# Patient Record
Sex: Female | Born: 1955 | Race: White | Hispanic: Yes | Marital: Married | Smoking: Never smoker
Health system: Southern US, Community
[De-identification: ages and names within clinical notes are randomized; demographics above are authoritative.]

## PROBLEM LIST (undated history)

## (undated) DIAGNOSIS — I509 Heart failure, unspecified: Secondary | ICD-10-CM

## (undated) DIAGNOSIS — M1990 Unspecified osteoarthritis, unspecified site: Secondary | ICD-10-CM

## (undated) DIAGNOSIS — N200 Calculus of kidney: Secondary | ICD-10-CM

## (undated) DIAGNOSIS — F32A Depression, unspecified: Secondary | ICD-10-CM

## (undated) DIAGNOSIS — E785 Hyperlipidemia, unspecified: Secondary | ICD-10-CM

## (undated) DIAGNOSIS — I10 Essential (primary) hypertension: Secondary | ICD-10-CM

## (undated) DIAGNOSIS — F329 Major depressive disorder, single episode, unspecified: Secondary | ICD-10-CM

## (undated) DIAGNOSIS — E119 Type 2 diabetes mellitus without complications: Secondary | ICD-10-CM

## (undated) DIAGNOSIS — I208 Other forms of angina pectoris: Secondary | ICD-10-CM

## (undated) DIAGNOSIS — F419 Anxiety disorder, unspecified: Secondary | ICD-10-CM

## (undated) DIAGNOSIS — I739 Peripheral vascular disease, unspecified: Secondary | ICD-10-CM

## (undated) DIAGNOSIS — IMO0001 Reserved for inherently not codable concepts without codable children: Secondary | ICD-10-CM

## (undated) HISTORY — DX: Hyperlipidemia, unspecified: E78.5

## (undated) HISTORY — DX: Anxiety disorder, unspecified: F41.9

## (undated) HISTORY — PX: JOINT REPLACEMENT: SHX530

## (undated) HISTORY — DX: Depression, unspecified: F32.A

## (undated) HISTORY — PX: SHOULDER ARTHROSCOPY: SHX128

## (undated) HISTORY — DX: Major depressive disorder, single episode, unspecified: F32.9

## (undated) HISTORY — DX: Unspecified osteoarthritis, unspecified site: M19.90

---

## 2007-05-22 HISTORY — PX: TOTAL HIP ARTHROPLASTY: SHX124

## 2012-10-18 ENCOUNTER — Observation Stay: Payer: Self-pay | Admitting: Internal Medicine

## 2012-10-18 LAB — COMPREHENSIVE METABOLIC PANEL
Albumin: 3.6 g/dL (ref 3.4–5.0)
Alkaline Phosphatase: 111 U/L (ref 50–136)
Bilirubin,Total: 0.2 mg/dL (ref 0.2–1.0)
Chloride: 105 mmol/L (ref 98–107)
Osmolality: 287 (ref 275–301)
SGOT(AST): 42 U/L — ABNORMAL HIGH (ref 15–37)
SGPT (ALT): 65 U/L (ref 12–78)
Sodium: 139 mmol/L (ref 136–145)

## 2012-10-18 LAB — CBC
HCT: 46.6 % (ref 35.0–47.0)
HGB: 15.7 g/dL (ref 12.0–16.0)
RBC: 5.25 10*6/uL — ABNORMAL HIGH (ref 3.80–5.20)
RDW: 12.7 % (ref 11.5–14.5)
WBC: 10.6 10*3/uL (ref 3.6–11.0)

## 2012-10-18 LAB — CK TOTAL AND CKMB (NOT AT ARMC)
CK, Total: 68 U/L (ref 21–215)
CK-MB: 1.1 ng/mL (ref 0.5–3.6)

## 2012-10-18 LAB — TROPONIN I: Troponin-I: <0.02 ng/mL

## 2012-10-18 LAB — LIPASE, BLOOD: Lipase: 171 U/L (ref 73–393)

## 2012-10-18 LAB — PROTIME-INR: INR: 0.9

## 2012-10-19 DIAGNOSIS — R079 Chest pain, unspecified: Secondary | ICD-10-CM

## 2012-10-19 LAB — TROPONIN I
Troponin-I: <0.02 ng/mL
Troponin-I: <0.02 ng/mL

## 2012-10-19 LAB — LIPID PANEL
Cholesterol: 207 mg/dL — ABNORMAL HIGH (ref 0–200)
HDL Cholesterol: 33 mg/dL — ABNORMAL LOW (ref 40–60)
Ldl Cholesterol, Calc: 142 mg/dL — ABNORMAL HIGH (ref 0–100)
Triglycerides: 159 mg/dL (ref 0–200)
VLDL Cholesterol, Calc: 32 mg/dL (ref 5–40)

## 2012-10-19 LAB — BASIC METABOLIC PANEL
Anion Gap: 7 (ref 7–16)
BUN: 14 mg/dL (ref 7–18)
Co2: 26 mmol/L (ref 21–32)
EGFR (African American): >60
EGFR (Non-African Amer.): >60
Osmolality: 289 (ref 275–301)
Potassium: 3.5 mmol/L (ref 3.5–5.1)

## 2012-10-19 LAB — HEMOGLOBIN A1C: Hemoglobin A1C: 9.2 % — ABNORMAL HIGH (ref 4.2–6.3)

## 2012-10-19 LAB — CK TOTAL AND CKMB (NOT AT ARMC)
CK, Total: 42 U/L (ref 21–215)
CK-MB: 0.9 ng/mL (ref 0.5–3.6)

## 2014-09-10 NOTE — Discharge Summary (Signed)
PATIENT NAME:  Melissa Prince, [name]:  [id number] DATE OF BIRTH:  1955/11/09  DATE OF ADMISSION:  10/18/2012 DATE OF DISCHARGE:  10/19/2012  ADMITTING PHYSICIAN:   Dr. Adrian SaranSital Mody. DISCHARGING PHYSICIAN:  Enid Baasadhika Audrea Bolte, M.D.  PRIMARY CARE PHYSICIAN[facility]  [facility].   DISCHARGE DIAGNOSES: 1.  Chest pain, likely secondary to unstable angina.  2.  Diabetes mellitus.  3.  Hypertension.  4.  History of atherosclerotic coronary artery disease with no prior myocardial infarction.  5.  History of viral meningitis.  6.  Obesity.  7.  Depression and anxiety.  8.  Hyperlipidemia.  9.  Diabetes.   DISCHARGE HOME MEDICATIONS:  1.  Aspirin 81 mg p.o. daily.  2.  Metoprolol succinate 150 mg p.o. daily.  3.  Lisinopril 40 mg p.o. daily.  4.  Effexor 37.5 mg p.o. daily.  5.  HCTZ 25 mg p.o. daily.  6.  Metformin 500 mg p.o. daily.  7.  Lantus 54 units subcutaneously once a day at bedtime.  8.  Colace 100 mg p.o. daily.  9.  Imdur 30 mg p.o. daily.  10.  Sublingual nitroglycerin 0.4 mg every 5 minutes as needed for chest pain.  11.  Lovastatin 40 mg p.o. daily.   DISCHARGE DIET: Low sodium and ADA diet.   DISCHARGE ACTIVITY: As tolerated.   FOLLOWUP INSTRUCTIONS:   1.  The patient advised to follow up with PCP in 1 week.  2.   The patient is also advised to call nuclear medicine scheduling and the number was given to the patient to schedule for an outpatient stress test in the following week to be done by [facility] for diagnosis of unstable angina.   LABORATORY DATA AND IMAGING STUDIES PRIOR TO DISCHARGE:  1.  Sodium 114, potassium 3.5, chloride 107, bicarb 26, BUN 14, creatinine 0.57, glucose 266 and calcium of 8.5. HbA1c is 9.2.  2.   LDL 142, HDL 33, triglycerides 159, total cholesterol 207.  3.  TSH within normal limits at 2.79.  4.  WBC 10.6, hemoglobin 15.7, hematocrit 46.6, platelet count is 203.  5.  Echocardiogram showing LV ejection fraction is 50% to 55%,  low normal global left ventricular systolic function and mild septal and anteroseptal hypokinesis, impaired relaxation of LV diastolic filling and mild concentric left ventricular hypertrophy is present.   BRIEF HOSPITAL COURSE:  Mr. Melissa Prince is a 59 year old obese female with past medical history significant for hypertension, diabetes, depression, who had a prior cardiac cath revealing atherosclerotic mild disease in the coronary arteries, presented to the hospital secondary to chest pain.  1.  Chest pain: She does have a lot of anxiety too, however, given her risk factors, it could be very well unstable angina. She was monitored on telemetry and 3 sets of troponins were done, all of which were negative. Her chest pain improved with nitro on admission, and did not have any chest pain at the time of discharge. Unfortunately, the patient got admitted over a weekend, and she was advised to stay until Monday to get her stress test done, and the patient, because she felt better, did not come to stay for the stress test, so an echocardiogram was ordered, which showed  low normal LV systolic function with mild diastolic dysfunction, EF is 50% to 55% and very mild septal, anteroseptal hypokinesis. Since her troponins were negative, she was asymptomatic, no EKG changes, and echo overall looked okay, she was discharged, and she was given the number to call nuclear  medicine scheduling for scheduling stress test as an outpatient in the following week, which the patient said she would do.  2.  Diabetes mellitus. HbA1c slightly elevated. She is on Lantus and also metformin. Sugars were not really very elevated while in the hospital.  3.  Hypertension. Her home medications were continued with lisinopril, metoprolol and HCTZ. However, her Norvasc was changed over to Imdur to see if that would help her angina pain.  4.  Her depression and anxiety were very well controlled with her Effexor.  Her clinical status has been  otherwise uneventful in the hospital.   DISCHARGE CONDITION: Stable.   DISCHARGE DISPOSITION: Home.   TIME SPENT ON DISCHARGE: 45 minutes.    ____________________________ Enid Baas, MD rk:dmm D: 10/20/2012 10:10:41 ET T: 10/20/2012 11:11:26 ET JOB#: [id number]  cc: Enid Baas, MD, <Dictator> [facility] Enid Baas MD ELECTRONICALLY SIGNED 10/27/2012 15:31

## 2014-09-10 NOTE — H&P (Signed)
PATIENT NAME:  Melissa Prince, Melissa Prince MR#:  [id number] DATE OF BIRTH:  February 01, 1956  DATE OF ADMISSION:  10/18/2012  PRIMARY CARE PHYSICIAN:  Lillette Boxer, PA, in [facility].   CHIEF COMPLAINT:  Chest pain.   HISTORY OF PRESENT ILLNESS: A 59 year old female with history of hypertension, hyperlipidemia, diabetes, with degenerative heart disease, who presents with the above complaint. Between 4:00 and 5:00 today, the patient was out on her porch when she developed left-sided substernal chest pain without any radiation, but she felt very nauseous. She felt it was kind of more like a pressure, like an elephant was sitting on her chest. It lasted about 15 minutes. Her husband called EMS. It actually was relieved with oxygen and nitroglycerin. She has not had any kind of chest pain or pressure since this treatment. Six years ago she said she had a cardiac catheterization at [facility]. They diagnosed her with degenerative heart disease and some small vessel disease.   REVIEW OF SYSTEMS:   CONSTITUTIONAL:  No fever, fatigue, weakness, weight loss or gain. EYES:  No blurry or double vision, glaucoma or cataracts.  EARS, NOSE, THROAT:  No ear pain, hearing loss, seasonal allergies, or epistaxis.  RESPIRATORY: No cough, wheezing, hemoptysis, dyspnea.  CARDIOVASCULAR:  Chest pain as mentioned above. No palpitations, orthopnea, syncope, edema, arrhythmia. GASTROINTESTINAL:  She had nausea with the chest pain. No vomiting, diarrhea, abdominal pain, melena, or ulcers.  GENITOURINARY:  No dysuria or hematuria.  ENDOCRINE:  No polyuria or polydipsia.  HEMATOLOGIC/LYMPHATIC: No anemia or easy bruising.  SKIN:  No rash or lesions. MUSCULOSKELETAL:  Limited activity due to her obesity.  NEUROLOGIC:  No history of CVA, TIA, seizures.  PSYCHIATRIC:  She does have depression.  PAST MEDICAL HISTORY:   1.  Diabetes. 2.  Hypertension. 3.  Questionable CAD with degenerative heart disease, as per her.  4.   History of viral meningitis. 5.  History of depression.   MEDICATIONS: 1.  Aspirin 81 mg daily.  2.  Lantus 54 units at bedtime.  3.  Metformin 500 mg daily.  4.  Norvasc 10 mg daily.  5.  Hydrochlorothiazide 25 mg daily.  6.  Lisinopril 40 mg daily.  7.  Metoprolol 150 mg daily.  8.  Effexor 37.5 mg b.i.d. 9.  Lovastatin 10 mg at night   ALLERGIES:  CODEINE and MORPHINE cause nausea and vomiting.   SURGICAL HISTORY:  She had a hip replacement.   FAMILY HISTORY: Positive for CAD. Her mom had CAD at age 35. Her dad had cancer. Brother has a pacemaker.   SOCIAL HISTORY: No tobacco, alcohol or drug use. She recently moved to [place] from [facility].   PHYSICAL EXAMINATION: VITAL SIGNS:  Temperature 98.1, pulse is 104, respirations 18, blood pressure 195/91, 100% on room air.  GENERAL: The patient is alert and oriented, not in acute distress.  HEENT:  Head is atraumatic. Pupils are round. Sclerae anicteric. Mucous membranes are moist. Oropharynx is clear.  NECK:  Supple, without JVD, carotid bruit, enlarged thyroid.  CARDIOVASCULAR:  Regular rate and rhythm. No appreciable murmur, gallops or rubs. Hard to palpate PMI due to body habitus.  LUNGS: Clear to auscultation without crackles, rales, rhonchi or wheezing. Normal to percussion.  BACK:  No CVA or vertebral tenderness.  ABDOMEN: Obese. Bowel sounds are positive. Nontender. Hard to appreciate organomegaly due to body habitus.  EXTREMITIES:  No clubbing, cyanosis or edema.  NEUROLOGIC:  Cranial nerves II through XII are grossly intact. No focal deficits. Finger-to-nose  is intact. Strength is 5/5 in all extremities.  SKIN:  Without rash or lesions.   LABORATORIES:  Magnesium 1.7. INR 0.9. Lipase is 171. Sodium 139, potassium 3.7, chloride 105, bicarb 26, BUN 17, creatinine 0.61, glucose 243, calcium 8.8. Bilirubin 0.2, alk phos 111, ALT 65, AST 42, total protein 7.5, albumin 3.6. Troponin less than 0.02. CK 60, CPK-MB 1.1. White  blood cells 7.6, hemoglobin 15.7, hematocrit 46.6, platelets are 233.   Chest x-ray shows no acute cardiopulmonary disease. EKG is sinus tachycardia with a left anterior fascicular block. There are no ST elevations or depressions.  ASSESSMENT AND PLAN:  A 59 year old female with questionable history of coronary artery disease, degenerative heart disease, diabetes, hypertension, who presents with unstable angina.   1.  Unstable angina. The patient will be admitted to telemetry. Will continue to cycle her cardiac enzymes. Will continue aspirin. Check fasting lipids. Continue statin therapy. I have also written for nitroglycerin p.r.n. If her troponins x 3 are all negative, then she will undergo a Myoview stress test. If these are positive, then we will need a cardiology consultation.   2.  Malignant hypertension. This could actually be resulting in her chest pain/pressure. She  received hydralazine in the ER, and I will continue her outpatient medications and have written for a p.r.n. hydralazine order.   3.  Tachycardia. Likely secondary to not taking her medications. She is on a beta blocker. She does not appear to be uncomfortable. We will restart her beta blocker, also we will monitor her on telemetry.   4.  Diabetes. We will continue Lantus. Patient also said that she is taking metformin, which we will hold. We will also give ADA diet.  5.  The patient is a FULL CODE STATUS.  Time spent:  Approximately 45 minutes.   ____________________________ Donell Beers. Benjie Karvonen, MD spm:mr D: 10/18/2012 21:32:45 ET T: 10/18/2012 22:05:13 ET JOB#: [id number]  cc: Creg Gilmer P. Benjie Karvonen, MD, <Dictator> [facility], Internal Medicine  Ikia Cincotta P Clarabelle Oscarson MD ELECTRONICALLY SIGNED 10/19/2012 15:27

## 2015-07-27 ENCOUNTER — Ambulatory Visit: Payer: Self-pay | Admitting: Family Medicine

## 2015-08-19 ENCOUNTER — Ambulatory Visit: Payer: Self-pay | Admitting: Family Medicine

## 2015-09-23 ENCOUNTER — Emergency Department
Admission: EM | Admit: 2015-09-23 | Discharge: 2015-09-23 | Disposition: A | Discharge status: Home / Self Care | Payer: Medicare Other | Attending: Emergency Medicine | Admitting: Emergency Medicine

## 2015-09-23 ENCOUNTER — Encounter: Payer: Self-pay | Admitting: *Deleted

## 2015-09-23 ENCOUNTER — Emergency Department: Payer: Medicare Other

## 2015-09-23 DIAGNOSIS — I10 Essential (primary) hypertension: Secondary | ICD-10-CM | POA: Diagnosis not present

## 2015-09-23 DIAGNOSIS — N23 Unspecified renal colic: Secondary | ICD-10-CM | POA: Diagnosis not present

## 2015-09-23 DIAGNOSIS — E119 Type 2 diabetes mellitus without complications: Secondary | ICD-10-CM | POA: Insufficient documentation

## 2015-09-23 DIAGNOSIS — R109 Unspecified abdominal pain: Secondary | ICD-10-CM | POA: Diagnosis present

## 2015-09-23 HISTORY — DX: Other forms of angina pectoris: I20.8

## 2015-09-23 HISTORY — DX: Type 2 diabetes mellitus without complications: E11.9

## 2015-09-23 HISTORY — DX: Essential (primary) hypertension: I10

## 2015-09-23 HISTORY — DX: Calculus of kidney: N20.0

## 2015-09-23 LAB — URINALYSIS COMPLETE WITH MICROSCOPIC (ARMC ONLY)
BILIRUBIN URINE: NEGATIVE
Bacteria, UA: NONE SEEN
Leukocytes, UA: NEGATIVE
NITRITE: NEGATIVE
PH: 6 (ref 5.0–8.0)
Protein, ur: NEGATIVE mg/dL
SPECIFIC GRAVITY, URINE: 1.011 (ref 1.005–1.030)

## 2015-09-23 LAB — BASIC METABOLIC PANEL
Anion gap: 12 (ref 5–15)
BUN: 13 mg/dL (ref 6–20)
CO2: 23 mmol/L (ref 22–32)
Calcium: 9.5 mg/dL (ref 8.9–10.3)
Chloride: 101 mmol/L (ref 101–111)
Creatinine, Ser: 0.72 mg/dL (ref 0.44–1.00)
GFR calc Af Amer: >60 mL/min (ref >60)
GFR calc non Af Amer: >60 mL/min (ref >60)
GLUCOSE: 257 mg/dL — AB (ref 65–99)
POTASSIUM: 3.7 mmol/L (ref 3.5–5.1)
Sodium: 136 mmol/L (ref 135–145)

## 2015-09-23 LAB — CBC
HEMATOCRIT: 45 % (ref 35.0–47.0)
Hemoglobin: 14.7 g/dL (ref 12.0–16.0)
MCH: 29 pg (ref 26.0–34.0)
MCHC: 32.7 g/dL (ref 32.0–36.0)
MCV: 88.8 fL (ref 80.0–100.0)
Platelets: 193 10*3/uL (ref 150–440)
RBC: 5.06 MIL/uL (ref 3.80–5.20)
RDW: 13.4 % (ref 11.5–14.5)
WBC: 11.5 10*3/uL — AB (ref 3.6–11.0)

## 2015-09-23 MED ORDER — KETOROLAC TROMETHAMINE 30 MG/ML IJ SOLN
30.0000 mg | Freq: Once | INTRAMUSCULAR | Status: AC
  Administered 2015-09-23: 30 mg via INTRAVENOUS
  Filled 2015-09-23: qty 1

## 2015-09-23 MED ORDER — KETOROLAC TROMETHAMINE 10 MG PO TABS: 10.0000 mg | ORAL_TABLET | Freq: Three times a day (TID) | ORAL | Status: AC | PRN

## 2015-09-23 MED ORDER — ONDANSETRON HCL 4 MG/2ML IJ SOLN
4.0000 mg | Freq: Once | INTRAMUSCULAR | Status: AC
  Administered 2015-09-23: 4 mg via INTRAVENOUS
  Filled 2015-09-23: qty 2

## 2015-09-23 MED ORDER — HYDROMORPHONE HCL 1 MG/ML IJ SOLN
0.5000 mg | Freq: Once | INTRAMUSCULAR | Status: AC
  Administered 2015-09-23: 0.5 mg via INTRAVENOUS
  Filled 2015-09-23: qty 1

## 2015-09-23 MED ORDER — SODIUM CHLORIDE 0.9 % IV BOLUS (SEPSIS)
1000.0000 mL | Freq: Once | INTRAVENOUS | Status: AC
  Administered 2015-09-23: 1000 mL via INTRAVENOUS

## 2015-09-23 MED ORDER — ONDANSETRON 4 MG PO TBDP: 4.0000 mg | ORAL_TABLET | Freq: Three times a day (TID) | ORAL | Status: DC | PRN

## 2015-09-23 MED ORDER — TAMSULOSIN HCL 0.4 MG PO CAPS: 0.4000 mg | ORAL_CAPSULE | Freq: Every day | ORAL | Status: DC

## 2015-09-23 MED ORDER — OXYCODONE-ACETAMINOPHEN 5-325 MG PO TABS: 1.0000 | ORAL_TABLET | ORAL | Status: DC | PRN

## 2015-09-23 NOTE — ED Notes (Signed)
Patient transported to CT 

## 2015-09-23 NOTE — ED Notes (Signed)
Pt back from CT

## 2015-09-23 NOTE — ED Notes (Signed)
Pt arrived via EMS from home after reporting onset of left sided flank pain beginning at 10:00 this morning. Pt has hx of kidney stones and reports last one was passed on Wednesday of this week. Pt reports decreased ability to urinate today. Pt received 30 Toradol IV via EMS before arrival to ED.

## 2015-09-23 NOTE — ED Notes (Signed)
Pt c/o nausea and has vomited times 1 - zofran given

## 2015-09-23 NOTE — ED Notes (Signed)
Pt given urine strainer and directions of usage

## 2015-09-23 NOTE — ED Provider Notes (Signed)
[facility] Emergency Department Provider Note  ____________________________________________  Time seen: Approximately 2:13 PM  I have reviewed the triage vital signs and the nursing notes.   HISTORY  Chief Complaint Flank Pain    HPI Melissa Prince is a 60 y.o. female with a history of renal colic presenting with left flank pain radiating to the left lower quadrant associated with passage of small stones. The patient reports that yesterday she saw some sediment in the toilet and developed some mild pain but at 10 AM she developed severe pain. This was associated with nausea and vomiting. No fever or chills. No diarrhea.   Past Medical History  Diagnosis Date  . Hypertension   . Diabetes mellitus without complication (HCC)   . Angina effort (HCC)   . Kidney stone     There are no active problems to display for this patient.   Past Surgical History  Procedure Laterality Date  . Total hip arthroplasty Right 2009    Current Outpatient Rx  Name  Route  Sig  Dispense  Refill  . ketorolac (TORADOL) 10 MG tablet   Oral   Take 1 tablet (10 mg total) by mouth every 8 (eight) hours as needed for moderate pain (with food).   15 tablet   0   . ondansetron (ZOFRAN ODT) 4 MG disintegrating tablet   Oral   Take 1 tablet (4 mg total) by mouth every 8 (eight) hours as needed for nausea or vomiting.   20 tablet   0   . oxyCODONE-acetaminophen (ROXICET) 5-325 MG tablet   Oral   Take 1-2 tablets by mouth every 4 (four) hours as needed for moderate pain or severe pain.   20 tablet   0   . tamsulosin (FLOMAX) 0.4 MG CAPS capsule   Oral   Take 1 capsule (0.4 mg total) by mouth daily.   15 capsule   0     Allergies Morphine and related and Codeine  History reviewed. No pertinent family history.  Social History Social History  Substance Use Topics  . Smoking status: Never Smoker   . Smokeless tobacco: None  . Alcohol Use: No    Review of  Systems Constitutional: No fever/chills. Eyes: No visual changes. ENT: No sore throat. No congestion or rhinorrhea. Cardiovascular: Denies chest pain. Denies palpitations. Respiratory: Denies shortness of breath.  No cough. Gastrointestinal: Positive left lower quadrant abdominal pain.  Positive nausea, positive vomiting.  No diarrhea.  No constipation. Genitourinary: Negative for dysuria. Musculoskeletal: Negative for back pain.Positive left flank pain Skin: Negative for rash. Neurological: Negative for headaches. No focal numbness, tingling or weakness.   10-point ROS otherwise negative.  ____________________________________________   PHYSICAL EXAM:  VITAL SIGNS: ED Triage Vitals  Enc Vitals Group     BP 09/23/15 1356 172/110 mmHg     Pulse Rate 09/23/15 1356 99     Resp 09/23/15 1356 16     Temp 09/23/15 1356 97.9 F (36.6 C)     Temp Source 09/23/15 1356 Oral     SpO2 09/23/15 1356 94 %     Weight 09/23/15 1356 221 lb (100.245 kg)     Height 09/23/15 1356  (1.549 m)     Head Cir --      Peak Flow --      Pain Score 09/23/15 1357 8     Pain Loc --      Pain Edu? --      Excl. in GC? --  Constitutional: Alert and oriented. Uncomfortable appearing but nontoxic. Answers questions appropriately. Eyes: Conjunctivae are normal.  EOMI. No scleral icterus. Head: Atraumatic. Nose: No congestion/rhinnorhea. Mouth/Throat: Mucous membranes are moist.  Neck: No stridor.  Supple.   Cardiovascular: Normal rate, regular rhythm. No murmurs, rubs or gallops.  Respiratory: Normal respiratory effort.  No accessory muscle use or retractions. Lungs CTAB.  No wheezes, rales or ronchi. Gastrointestinal: Soft and nondistended.  Positive left lower quadrant tenderness. Positive left flank pain. No guarding or rebound.  No peritoneal signs. Musculoskeletal: No LE edema. No ttp in the calves or palpable cords.  Negative Homan's sign. Neurologic:  A&Ox3.  Speech is clear.  Face and  smile are symmetric.  EOMI.  Moves all extremities well. Skin:  Skin is diaphoretic and intact. No rash noted. Psychiatric: Mood and affect are normal. Speech and behavior are normal.  Normal judgement.  ____________________________________________   LABS (all labs ordered are listed, but only abnormal results are displayed)  Labs Reviewed  URINALYSIS COMPLETEWITH MICROSCOPIC ([facility] ONLY)  CBC  BASIC METABOLIC PANEL   ____________________________________________  EKG  Not indicated ____________________________________________  RADIOLOGY  Ct Renal Stone Study  09/23/2015  CLINICAL DATA:  Left-sided flank pain with nausea and vomiting EXAM: CT ABDOMEN AND PELVIS WITHOUT CONTRAST TECHNIQUE: Multidetector CT imaging of the abdomen and pelvis was performed following the standard protocol without oral or intravenous contrast material administration. COMPARISON:  None. FINDINGS: Lower chest:  Lung bases are clear. Hepatobiliary: No focal liver lesions are identified on this noncontrast enhanced study. There appears to be sludge within the gallbladder. There is no gallbladder wall thickening. No biliary duct dilatation evident Pancreas: No pancreatic mass or inflammatory focus. Spleen: No splenic lesions are evident. Adrenals/Urinary Tract: No adrenal lesions evident. There is a cyst arising from the posterior mid right kidney measuring 5.2 x 4.9 cm. There is a cyst in the posterior mid right kidney measuring 1.8 x 1.7 cm. There is a cyst in the mid lateral mid kidney on the left measuring 2.5 x 2.3 cm. There is a probable parapelvic cyst on the left measuring 1.8 x 1.2 cm. There is no demonstrable hydronephrosis on the right. There is mild hydronephrosis on the left. There is no renal or ureteral calculus on the right. On the left, there is no intrarenal calculus. There is a calculus in the proximal left ureter just beyond the left ureteropelvic junction measuring 5 x 4 mm. No other ureteral calculi  are identified. Urinary bladder is midline with wall thickness within normal limits. Stomach/Bowel: There is no bowel wall or mesenteric thickening. No bowel obstruction. No free air or portal venous air. Vascular/Lymphatic: There is atherosclerotic calcification in the aorta and common iliac arteries. There does not appear to be major mesenteric vessel high-grade obstruction or occlusion on this noncontrast enhanced study. There is no adenopathy in the abdomen or pelvis. Reproductive: Uterus is anteverted. There is no pelvic mass or pelvic fluid collection. Other: Appendix appears normal. No abscess or ascites is evident in the abdomen or pelvis. Musculoskeletal: There is degenerative change in the lower lumbar spine. There is vacuum phenomenon at L5-S1. There are no blastic or lytic bone lesions. There is a total hip replacement on the right. No intramuscular or abdominal wall lesion evident. IMPRESSION: 5 x 4 mm calculus in the proximal left ureter with mild hydronephrosis on the left. No bowel obstruction.  No abscess.  Appendix appears normal. There appears to be sludge in the gallbladder. The gallbladder wall does not  appear thickened. Electronically Signed   By: Bretta BangWilliam  Woodruff III M.D.   On: 09/23/2015 14:46    ____________________________________________   PROCEDURES  Procedure(s) performed: None  Critical Care performed: No ____________________________________________   INITIAL IMPRESSION / ASSESSMENT AND PLAN / ED COURSE  Pertinent labs & imaging results that were available during my care of the patient were reviewed by me and considered in my medical decision making (see chart for details).  60 y.o. female with a history of renal colic presenting with left flank pain radiating to the left lower quadrant associated with nausea and vomiting. Overall, the patient is uncomfortable appearing but nontoxic. We'll get a CT renal stone, labs, and initiate aggressive symptomatically and  management of her pain.  ____________________________________________  FINAL CLINICAL IMPRESSION(S) / ED DIAGNOSES  Final diagnoses:  Renal colic on left side      NEW MEDICATIONS STARTED DURING THIS VISIT:  New Prescriptions   KETOROLAC (TORADOL) 10 MG TABLET    Take 1 tablet (10 mg total) by mouth every 8 (eight) hours as needed for moderate pain (with food).   ONDANSETRON (ZOFRAN ODT) 4 MG DISINTEGRATING TABLET    Take 1 tablet (4 mg total) by mouth every 8 (eight) hours as needed for nausea or vomiting.   OXYCODONE-ACETAMINOPHEN (ROXICET) 5-325 MG TABLET    Take 1-2 tablets by mouth every 4 (four) hours as needed for moderate pain or severe pain.   TAMSULOSIN (FLOMAX) 0.4 MG CAPS CAPSULE    Take 1 capsule (0.4 mg total) by mouth daily.     Rockne MenghiniAnne-Caroline Khalil Belote, MD 09/23/15 1531

## 2015-09-23 NOTE — Discharge Instructions (Signed)
Please continue to strain your urine and bring the stone or stones with you to your follow up appointment if you catch any.  Please return to the emergency department if you develop worsening pain, fever, inability to keep down fluids, or any other symptoms concerning to you.  Kidney Stones Kidney stones (urolithiasis) are deposits that form inside your kidneys. The intense pain is caused by the stone moving through the urinary tract. When the stone moves, the ureter goes into spasm around the stone. The stone is usually passed in the urine.  CAUSES   A disorder that makes certain neck glands produce too much parathyroid hormone (primary hyperparathyroidism).  A buildup of uric acid crystals, similar to gout in your joints.  Narrowing (stricture) of the ureter.  A kidney obstruction present at birth (congenital obstruction).  Previous surgery on the kidney or ureters.  Numerous kidney infections. SYMPTOMS   Feeling sick to your stomach (nauseous).  Throwing up (vomiting).  Blood in the urine (hematuria).  Pain that usually spreads (radiates) to the groin.  Frequency or urgency of urination. DIAGNOSIS   Taking a history and physical exam.  Blood or urine tests.  CT scan.  Occasionally, an examination of the inside of the urinary bladder (cystoscopy) is performed. TREATMENT   Observation.  Increasing your fluid intake.  Extracorporeal shock wave lithotripsy--This is a noninvasive procedure that uses shock waves to break up kidney stones.  Surgery may be needed if you have severe pain or persistent obstruction. There are various surgical procedures. Most of the procedures are performed with the use of small instruments. Only small incisions are needed to accommodate these instruments, so recovery time is minimized. The size, location, and chemical composition are all important variables that will determine the proper choice of action for you. Talk to your health care  provider to better understand your situation so that you will minimize the risk of injury to yourself and your kidney.  HOME CARE INSTRUCTIONS   Drink enough water and fluids to keep your urine clear or pale yellow. This will help you to pass the stone or stone fragments.  Strain all urine through the provided strainer. Keep all particulate matter and stones for your health care provider to see. The stone causing the pain may be as small as a grain of salt. It is very important to use the strainer each and every time you pass your urine. The collection of your stone will allow your health care provider to analyze it and verify that a stone has actually passed. The stone analysis will often identify what you can do to reduce the incidence of recurrences.  Only take over-the-counter or prescription medicines for pain, discomfort, or fever as directed by your health care provider.  Keep all follow-up visits as told by your health care provider. This is important.  Get follow-up X-rays if required. The absence of pain does not always mean that the stone has passed. It may have only stopped moving. If the urine remains completely obstructed, it can cause loss of kidney function or even complete destruction of the kidney. It is your responsibility to make sure X-rays and follow-ups are completed. Ultrasounds of the kidney can show blockages and the status of the kidney. Ultrasounds are not associated with any radiation and can be performed easily in a matter of minutes.  Make changes to your daily diet as told by your health care provider. You may be told to:  Limit the amount of salt that  you eat.  Eat 5 or more servings of fruits and vegetables each day.  Limit the amount of meat, poultry, fish, and eggs that you eat.  Collect a 24-hour urine sample as told by your health care provider.You may need to collect another urine sample every 6-12 months. SEEK MEDICAL CARE IF:  You experience pain that  is progressive and unresponsive to any pain medicine you have been prescribed. SEEK IMMEDIATE MEDICAL CARE IF:   Pain cannot be controlled with the prescribed medicine.  You have a fever or shaking chills.  The severity or intensity of pain increases over 18 hours and is not relieved by pain medicine.  You develop a new onset of abdominal pain.  You feel faint or pass out.  You are unable to urinate.   This information is not intended to replace advice given to you by your health care provider. Make sure you discuss any questions you have with your health care provider.   Document Released: 05/07/2005 Document Revised: 01/26/2015 Document Reviewed: 10/08/2012 Elsevier Interactive Patient Education [employer].

## 2015-09-25 ENCOUNTER — Encounter
Admission: EM | Disposition: A | Discharge status: Home / Self Care | Payer: Self-pay | Source: Home / Self Care | Attending: Student

## 2015-09-25 ENCOUNTER — Emergency Department
Admission: EM | Admit: 2015-09-25 | Discharge: 2015-09-25 | Disposition: A | Discharge status: Home / Self Care | Payer: Medicare Other | Attending: Student | Admitting: Student

## 2015-09-25 ENCOUNTER — Emergency Department: Payer: Medicare Other | Admitting: Anesthesiology

## 2015-09-25 DIAGNOSIS — Z7984 Long term (current) use of oral hypoglycemic drugs: Secondary | ICD-10-CM | POA: Insufficient documentation

## 2015-09-25 DIAGNOSIS — E119 Type 2 diabetes mellitus without complications: Secondary | ICD-10-CM | POA: Diagnosis not present

## 2015-09-25 DIAGNOSIS — R109 Unspecified abdominal pain: Secondary | ICD-10-CM

## 2015-09-25 DIAGNOSIS — I10 Essential (primary) hypertension: Secondary | ICD-10-CM | POA: Diagnosis not present

## 2015-09-25 DIAGNOSIS — N201 Calculus of ureter: Secondary | ICD-10-CM | POA: Diagnosis not present

## 2015-09-25 DIAGNOSIS — R1010 Upper abdominal pain, unspecified: Secondary | ICD-10-CM

## 2015-09-25 HISTORY — PX: CYSTOSCOPY W/ URETERAL STENT PLACEMENT: SHX1429

## 2015-09-25 LAB — COMPREHENSIVE METABOLIC PANEL
ALK PHOS: 76 U/L (ref 38–126)
ALT: 38 U/L (ref 14–54)
AST: 25 U/L (ref 15–41)
Albumin: 4.1 g/dL (ref 3.5–5.0)
Anion gap: 11 (ref 5–15)
BILIRUBIN TOTAL: 0.8 mg/dL (ref 0.3–1.2)
BUN: 12 mg/dL (ref 6–20)
CALCIUM: 9.4 mg/dL (ref 8.9–10.3)
CO2: 25 mmol/L (ref 22–32)
CREATININE: 1.03 mg/dL — AB (ref 0.44–1.00)
Chloride: 99 mmol/L — ABNORMAL LOW (ref 101–111)
GFR calc Af Amer: >60 mL/min (ref >60)
GFR, EST NON AFRICAN AMERICAN: 57 mL/min — AB (ref >60)
Glucose, Bld: 284 mg/dL — ABNORMAL HIGH (ref 65–99)
Potassium: 3.5 mmol/L (ref 3.5–5.1)
Sodium: 135 mmol/L (ref 135–145)
TOTAL PROTEIN: 7.8 g/dL (ref 6.5–8.1)

## 2015-09-25 LAB — CBC WITH DIFFERENTIAL/PLATELET
BASOS ABS: 0 10*3/uL (ref 0–0.1)
Basophils Relative: 0 %
Eosinophils Absolute: 0.1 10*3/uL (ref 0–0.7)
Eosinophils Relative: 1 %
HEMATOCRIT: 43.9 % (ref 35.0–47.0)
HEMOGLOBIN: 14.6 g/dL (ref 12.0–16.0)
Lymphs Abs: 1.2 10*3/uL (ref 1.0–3.6)
MCH: 29.1 pg (ref 26.0–34.0)
MCHC: 33.4 g/dL (ref 32.0–36.0)
MCV: 87.3 fL (ref 80.0–100.0)
MONO ABS: 0.9 10*3/uL (ref 0.2–0.9)
Monocytes Relative: 8 %
NEUTROS ABS: 8.9 10*3/uL — AB (ref 1.4–6.5)
Neutrophils Relative %: 80 %
Platelets: 177 10*3/uL (ref 150–440)
RBC: 5.03 MIL/uL (ref 3.80–5.20)
RDW: 13.3 % (ref 11.5–14.5)
WBC: 11.1 10*3/uL — ABNORMAL HIGH (ref 3.6–11.0)

## 2015-09-25 LAB — URINALYSIS COMPLETE WITH MICROSCOPIC (ARMC ONLY)
BACTERIA UA: NONE SEEN
BILIRUBIN URINE: NEGATIVE
Glucose, UA: 150 mg/dL — AB
Hgb urine dipstick: NEGATIVE
KETONES UR: NEGATIVE mg/dL
Nitrite: NEGATIVE
PH: 5 (ref 5.0–8.0)
PROTEIN: NEGATIVE mg/dL
RBC / HPF: NONE SEEN RBC/hpf (ref 0–5)
SQUAMOUS EPITHELIAL / LPF: NONE SEEN
Specific Gravity, Urine: 1.011 (ref 1.005–1.030)

## 2015-09-25 SURGERY — CYSTOSCOPY, FLEXIBLE, WITH STENT REPLACEMENT
Anesthesia: General | Laterality: Left

## 2015-09-25 MED ORDER — LIDOCAINE HCL (CARDIAC) 20 MG/ML IV SOLN
INTRAVENOUS | Status: DC | PRN
  Administered 2015-09-25: 40 mg via INTRAVENOUS

## 2015-09-25 MED ORDER — ONDANSETRON HCL 4 MG/2ML IJ SOLN
INTRAMUSCULAR | Status: DC | PRN
  Administered 2015-09-25: 4 mg via INTRAVENOUS

## 2015-09-25 MED ORDER — OXYBUTYNIN CHLORIDE 5 MG PO TABS: 5.0000 mg | ORAL_TABLET | Freq: Three times a day (TID) | ORAL | Status: DC | PRN

## 2015-09-25 MED ORDER — MIDAZOLAM HCL 2 MG/2ML IJ SOLN
INTRAMUSCULAR | Status: DC | PRN
  Administered 2015-09-25: 2 mg via INTRAVENOUS

## 2015-09-25 MED ORDER — PROPOFOL 10 MG/ML IV BOLUS
INTRAVENOUS | Status: DC | PRN
  Administered 2015-09-25: 150 mg via INTRAVENOUS

## 2015-09-25 MED ORDER — ONDANSETRON 4 MG PO TBDP: 4.0000 mg | ORAL_TABLET | Freq: Three times a day (TID) | ORAL | Status: AC | PRN

## 2015-09-25 MED ORDER — CEFAZOLIN SODIUM 1 G IJ SOLR
INTRAMUSCULAR | Status: DC | PRN
  Administered 2015-09-25: 2 g via INTRAMUSCULAR

## 2015-09-25 MED ORDER — OXYCODONE-ACETAMINOPHEN 5-325 MG PO TABS: 1.0000 | ORAL_TABLET | ORAL | Status: DC | PRN

## 2015-09-25 MED ORDER — FENTANYL CITRATE (PF) 100 MCG/2ML IJ SOLN
INTRAMUSCULAR | Status: DC | PRN
  Administered 2015-09-25 (×4): 25 ug via INTRAVENOUS

## 2015-09-25 MED ORDER — SODIUM CHLORIDE 0.9 % IV SOLN
INTRAVENOUS | Status: DC | PRN
  Administered 2015-09-25: 15:00:00 via INTRAVENOUS

## 2015-09-25 MED ORDER — SODIUM CHLORIDE 0.9 % IV BOLUS (SEPSIS)
1000.0000 mL | Freq: Once | INTRAVENOUS | Status: AC
  Administered 2015-09-25: 1000 mL via INTRAVENOUS

## 2015-09-25 MED ORDER — KETOROLAC TROMETHAMINE 30 MG/ML IJ SOLN
15.0000 mg | Freq: Once | INTRAMUSCULAR | Status: AC
  Administered 2015-09-25: 15 mg via INTRAVENOUS
  Filled 2015-09-25: qty 1

## 2015-09-25 MED ORDER — ONDANSETRON HCL 4 MG/2ML IJ SOLN: 4.0000 mg | Freq: Once | INTRAMUSCULAR | Status: DC | PRN

## 2015-09-25 MED ORDER — FENTANYL CITRATE (PF) 100 MCG/2ML IJ SOLN: 25.0000 ug | INTRAMUSCULAR | Status: DC | PRN

## 2015-09-25 MED ORDER — AMOXICILLIN-POT CLAVULANATE 875-125 MG PO TABS: 1.0000 | ORAL_TABLET | Freq: Two times a day (BID) | ORAL | Status: DC

## 2015-09-25 SURGICAL SUPPLY — 22 items
BAG DRAIN CYSTO-URO LG1000N (MISCELLANEOUS) ×2 IMPLANT
CATH FOL 2WAY LX 16X5 (CATHETERS) IMPLANT
CATH URETL 5X70 OPEN END (CATHETERS) ×2 IMPLANT
CONRAY 43 FOR UROLOGY 50M (MISCELLANEOUS) ×2 IMPLANT
GLOVE BIO SURGEON STRL SZ 6.5 (GLOVE) ×6 IMPLANT
GOWN STRL REUS W/ TWL LRG LVL4 (GOWN DISPOSABLE) ×2 IMPLANT
GOWN STRL REUS W/TWL LRG LVL4 (GOWN DISPOSABLE) ×2
HOLDER FOLEY CATH W/STRAP (MISCELLANEOUS) IMPLANT
KIT RM TURNOVER CYSTO AR (KITS) ×2 IMPLANT
PACK CYSTO AR (MISCELLANEOUS) ×2 IMPLANT
PREP PVP WINGED SPONGE (MISCELLANEOUS) ×2 IMPLANT
SENSORWIRE 0.038 NOT ANGLED (WIRE) ×2
SET CYSTO W/LG BORE CLAMP LF (SET/KITS/TRAYS/PACK) ×2 IMPLANT
SOL .9 NS 3000ML IRR  AL (IV SOLUTION) ×1
SOL .9 NS 3000ML IRR UROMATIC (IV SOLUTION) ×1 IMPLANT
STENT URET 6FRX22 CONTOUR (STENTS) ×2 IMPLANT
STENT URET 6FRX24 CONTOUR (STENTS) IMPLANT
STENT URET 6FRX26 CONTOUR (STENTS) IMPLANT
SURGILUBE 2OZ TUBE FLIPTOP (MISCELLANEOUS) ×2 IMPLANT
SYRINGE IRR TOOMEY STRL 70CC (SYRINGE) ×2 IMPLANT
WATER STERILE IRR 1000ML POUR (IV SOLUTION) ×2 IMPLANT
WIRE SENSOR 0.038 NOT ANGLED (WIRE) ×1 IMPLANT

## 2015-09-25 NOTE — Anesthesia Preprocedure Evaluation (Addendum)
Anesthesia Evaluation  Patient identified by MRN, date of birth, ID band Patient awake    Reviewed: Allergy & Precautions, NPO status , Patient's Chart, lab work & pertinent test results  History of Anesthesia Complications (+) PONVNegative for: history of anesthetic complications  Airway Mallampati: II       Dental   Pulmonary neg pulmonary ROS,           Cardiovascular hypertension, Pt. on medications + angina with exertion      Neuro/Psych TIA (tingling mouth, no problems since)negative neurological ROS     GI/Hepatic negative GI ROS, Neg liver ROS,   Endo/Other  diabetes, Oral Hypoglycemic Agents  Renal/GU Renal disease (stones, UTI)negative Renal ROS     Musculoskeletal   Abdominal   Peds  Hematology negative hematology ROS (+)   Anesthesia Other Findings   Reproductive/Obstetrics                            Anesthesia Physical Anesthesia Plan  ASA: III and emergent  Anesthesia Plan: General   Post-op Pain Management:    Induction:   Airway Management Planned: LMA  Additional Equipment:   Intra-op Plan:   Post-operative Plan:   Informed Consent: I have reviewed the patients History and Physical, chart, labs and discussed the procedure including the risks, benefits and alternatives for the proposed anesthesia with the patient or authorized representative who has indicated his/her understanding and acceptance.     Plan Discussed with:   Anesthesia Plan Comments:         Anesthesia Quick Evaluation

## 2015-09-25 NOTE — ED Provider Notes (Signed)
[facility] Emergency Department Provider Note   ____________________________________________  Time seen: Approximately 10:08 AM  I have reviewed the triage vital signs and the nursing notes.   HISTORY  Chief Complaint Flank Pain    HPI Melissa Prince is a 60 y.o. female with history of prior kidney stones, diabetes, hypertension who presents for evaluation of persistent left flank pain in the setting of recently diagnosed 5 mm proximal left ureteral stone, pain has been constant since onset, is currently severe, only intermittently mildly improves with the Toradol. Patient was seen in this emergency department on 09/23/2015 with left flank pain and 5 x 4 mm proximal left ureteral stone was noted with mild Hydro. She was discharged with expectant management. She reports today her pain was severe and not helped with her prescribed medications. She did have chills last night but no documented fever. No vomiting or diarrhea. No dysuria.   Past Medical History  Diagnosis Date  . Hypertension   . Diabetes mellitus without complication (HCC)   . Angina effort (HCC)   . Kidney stone     There are no active problems to display for this patient.   Past Surgical History  Procedure Laterality Date  . Total hip arthroplasty Right 2009    Current Outpatient Rx  Name  Route  Sig  Dispense  Refill  . amoxicillin-clavulanate (AUGMENTIN) 875-125 MG tablet   Oral   Take 1 tablet by mouth 2 (two) times daily.   14 tablet   0   . ondansetron (ZOFRAN ODT) 4 MG disintegrating tablet   Oral   Take 1 tablet (4 mg total) by mouth every 8 (eight) hours as needed for nausea or vomiting.   20 tablet   0   . oxybutynin (DITROPAN) 5 MG tablet   Oral   Take 1 tablet (5 mg total) by mouth every 8 (eight) hours as needed for bladder spasms.   30 tablet   0   . oxyCODONE-acetaminophen (ROXICET) 5-325 MG tablet   Oral   Take 1-2 tablets by mouth every 4 (four) hours as  needed for moderate pain or severe pain.   10 tablet   0     Allergies Morphine and related and Codeine  No family history on file.  Social History Social History  Substance Use Topics  . Smoking status: Never Smoker   . Smokeless tobacco: None  . Alcohol Use: No    Review of Systems Constitutional: No fever, +chills Eyes: No visual changes. ENT: No sore throat. Cardiovascular: Denies chest pain. Respiratory: Denies shortness of breath. Gastrointestinal: No abdominal pain.  No nausea, no vomiting.  No diarrhea.  No constipation. Genitourinary: Negative for dysuria. Musculoskeletal: Positive for flank pain. Skin: Negative for rash. Neurological: Negative for headaches, focal weakness or numbness.  10-point ROS otherwise negative.  ____________________________________________   PHYSICAL EXAM:  VITAL SIGNS: ED Triage Vitals  Enc Vitals Group     BP 09/25/15 0956 159/71 mmHg     Pulse Rate 09/25/15 0956 116     Resp 09/25/15 0956 18     Temp 09/25/15 0956 99.1 F (37.3 C)     Temp Source 09/25/15 0956 Oral     SpO2 09/25/15 0956 96 %     Weight 09/25/15 0956 225 lb (102.059 kg)     Height 09/25/15 0956 5\' 1"  (1.549 m)     Head Cir --      Peak Flow --      Pain  Score 09/25/15 0956 10     Pain Loc --      Pain Edu? --      Excl. in GC? --     Constitutional: Alert and oriented. In mild distress secondary to pain. Eyes: Conjunctivae are normal. PERRL. EOMI. Head: Atraumatic. Nose: No congestion/rhinnorhea. Mouth/Throat: Mucous membranes are moist.  Oropharynx non-erythematous. Neck: No stridor. Supple without meningismus. Cardiovascular: Mildly tachycardic rate, regular rhythm. Grossly normal heart sounds.  Good peripheral circulation. Respiratory: Normal respiratory effort.  No retractions. Lungs CTAB. Gastrointestinal: Soft and nontender. No distention.  No CVA tenderness. Genitourinary: deferred Musculoskeletal: No lower extremity tenderness nor edema.   No joint effusions. Neurologic:  Normal speech and language. No gross focal neurologic deficits are appreciated.  Skin:  Skin is warm, dry and intact. No rash noted. Psychiatric: Mood and affect are normal. Speech and behavior are normal.  ____________________________________________   LABS (all labs ordered are listed, but only abnormal results are displayed)  Labs Reviewed  CBC WITH DIFFERENTIAL/PLATELET - Abnormal; Notable for the following:    WBC 11.1 (*)    Neutro Abs 8.9 (*)    All other components within normal limits  COMPREHENSIVE METABOLIC PANEL - Abnormal; Notable for the following:    Chloride 99 (*)    Glucose, Bld 284 (*)    Creatinine, Ser 1.03 (*)    GFR calc non Af Amer 57 (*)    All other components within normal limits  URINALYSIS COMPLETEWITH MICROSCOPIC ([facility] ONLY) - Abnormal; Notable for the following:    Color, Urine YELLOW (*)    APPearance CLEAR (*)    Glucose, UA 150 (*)    Leukocytes, UA 1+ (*)    All other components within normal limits  URINE CULTURE   ____________________________________________  EKG  none ____________________________________________  RADIOLOGY  none ____________________________________________   PROCEDURES  Procedure(s) performed: None  Critical Care performed: No  ____________________________________________   INITIAL IMPRESSION / ASSESSMENT AND PLAN / ED COURSE  Pertinent labs & imaging results that were available during my care of the patient were reviewed by me and considered in my medical decision making (see chart for details).  Melissa Prince is a 60 y.o. female with history of prior kidney stones, diabetes, hypertension who presents for evaluation of persistent left flank pain in the setting of recently diagnosed 5 mm proximal left ureteral stone. On exam, she is in mild distress secondary to pain. She is mildly tachycardic, temperature is 99.1, the remainder of her vital signs are stable, she is  afebrile. Suspect persistent pain secondary to known ureterolithiasis. We'll treat her pain, give IV fluids, repeat labs, urinalysis and discuss with urology.  ----------------------------------------- 1:09 PM on 09/25/2015 -----------------------------------------  Patient with mild leukocytosis, urinalysis is concerning for infection. I discussed case with Dr. Vanna Scotland of urology and given concern for infected stone, she will see the patient in the emergency department and likely take to the operating room for stent placement. ____________________________________________   FINAL CLINICAL IMPRESSION(S) / ED DIAGNOSES  Final diagnoses:  Ureterolithiasis  Flank pain, acute      NEW MEDICATIONS STARTED DURING THIS VISIT:  Current Discharge Medication List    START taking these medications   Details  amoxicillin-clavulanate (AUGMENTIN) 875-125 MG tablet Take 1 tablet by mouth 2 (two) times daily. Qty: 14 tablet, Refills: 0    oxybutynin (DITROPAN) 5 MG tablet Take 1 tablet (5 mg total) by mouth every 8 (eight) hours as needed for bladder spasms. Qty: 30 tablet, Refills:  0         Note:  This document was prepared using Dragon voice recognition software and may include unintentional dictation errors.    Gayla Doss, MD 09/25/15 1556

## 2015-09-25 NOTE — H&P (Signed)
Urology Consult  I have been asked to see the patient by Dr. Inocencio Homes, for evaluation and management of left ureteral stone.  Chief Complaint: left flank pain, fevers  History of Present Illness: Melissa Prince is a 60 y.o. year old female who presented to the emergency room today with severe left flank pain. She was previously in the emergency room 2 days ago found to have a 5 mm proximal left ureteral stone with mild hydronephrosis. At that point, she was discharged home with expectant management. Unfortunately, yesterday she developed fevers (subjective), chills, and had worsening pain. She returned to the emergency room today for reevaluation.  In the emergency room, she does have a UA with positive 1+ LE concerning for possible infection (change from previous UA). She is tachycardic and has a low-grade temp to 99.  WBC 11.1    She is a diabetic. She does endorse nausea/vomiting, and by mouth intolerance.  She has passed several stones and previous occasion without surgical intervention.    Past Medical History  Diagnosis Date  . Hypertension   . Diabetes mellitus without complication (HCC)   . Angina effort (HCC)   . Kidney stone     Past Surgical History  Procedure Laterality Date  . Total hip arthroplasty Right 2009    Home Medications:    Medication List    ASK your doctor about these medications        ketorolac 10 MG tablet  Commonly known as:  TORADOL  Take 1 tablet (10 mg total) by mouth every 8 (eight) hours as needed for moderate pain (with food).     ondansetron 4 MG disintegrating tablet  Commonly known as:  ZOFRAN ODT  Take 1 tablet (4 mg total) by mouth every 8 (eight) hours as needed for nausea or vomiting.     oxyCODONE-acetaminophen 5-325 MG tablet  Commonly known as:  ROXICET  Take 1-2 tablets by mouth every 4 (four) hours as needed for moderate pain or severe pain.     tamsulosin 0.4 MG Caps capsule  Commonly known as:  FLOMAX  Take 1 capsule  (0.4 mg total) by mouth daily.        Allergies:  Allergies  Allergen Reactions  . Morphine And Related Nausea And Vomiting  . Codeine Nausea And Vomiting    Family history negative for GU malignancy  Social History:  reports that she has never smoked. She does not have any smokeless tobacco history on file. She reports that she does not drink alcohol or use illicit drugs.  ROS: A complete review of systems was performed.  All systems are negative except for pertinent findings as noted.  Physical Exam:  Vital signs in last 24 hours: Temp:  [99.1 F (37.3 C)] 99.1 F (37.3 C) (05/07 0956) Pulse Rate:  [101-116] 101 (05/07 1130) Resp:  [16-18] 16 (05/07 1130) BP: (124-159)/(59-71) 124/59 mmHg (05/07 1130) SpO2:  [96 %] 96 % (05/07 1130) Weight:  [225 lb (102.059 kg)] 225 lb (102.059 kg) (05/07 0956) Constitutional:  Alert and oriented, No acute distress HEENT: [place] AT, moist mucus membranes.  Trachea midline, no masses Cardiovascular: Regular rate and rhythm, no clubbing, cyanosis, or edema. RRR. Respiratory: Normal respiratory effort, lungs clear bilaterally.  CTAB. GI: Abdomen is soft, tenderness in  LLQ, nondistended, no abdominal masses.  Obese.   GU: + left CVA tenderness. Skin: No rashes, bruises or suspicious lesions Lymph: No cervical adenopathy Neurologic: Grossly intact, no focal deficits, moving all 4  extremities Psychiatric: Normal mood and affect   Laboratory Data:   Recent Labs  09/23/15 1402 09/25/15 1017  WBC 11.5* 11.1*  HGB 14.7 14.6  HCT 45.0 43.9    Recent Labs  09/23/15 1402 09/25/15 1017  NA 136 135  K 3.7 3.5  CL 101 99*  CO2 23 25  GLUCOSE 257* 284*  BUN 13 12  CREATININE 0.72 1.03*  CALCIUM 9.5 9.4   Component     Latest Ref Rng 09/23/2015  Color, Urine     YELLOW STRAW (A)  Appearance     CLEAR CLEAR (A)  Glucose     NEGATIVE mg/dL >454>500 (A)  Bilirubin Urine     NEGATIVE NEGATIVE  Ketones, ur     NEGATIVE mg/dL TRACE (A)    Specific Gravity, Urine     1.005 - 1.030 1.011  Hgb urine dipstick     NEGATIVE 2+ (A)  pH     5.0 - 8.0 6.0  Protein     NEGATIVE mg/dL NEGATIVE  Nitrite     NEGATIVE NEGATIVE  Leukocytes, UA     NEGATIVE NEGATIVE  RBC / HPF     0 - 5 RBC/hpf 6-30  WBC, UA     0 - 5 WBC/hpf 0-5  Bacteria, UA     NONE SEEN NONE SEEN  Squamous Epithelial / LPF     NONE SEEN 0-5 (A)     Radiologic Imaging: Ct Renal Stone Study  09/23/2015  CLINICAL DATA:  Left-sided flank pain with nausea and vomiting EXAM: CT ABDOMEN AND PELVIS WITHOUT CONTRAST TECHNIQUE: Multidetector CT imaging of the abdomen and pelvis was performed following the standard protocol without oral or intravenous contrast material administration. COMPARISON:  None. FINDINGS: Lower chest:  Lung bases are clear. Hepatobiliary: No focal liver lesions are identified on this noncontrast enhanced study. There appears to be sludge within the gallbladder. There is no gallbladder wall thickening. No biliary duct dilatation evident Pancreas: No pancreatic mass or inflammatory focus. Spleen: No splenic lesions are evident. Adrenals/Urinary Tract: No adrenal lesions evident. There is a cyst arising from the posterior mid right kidney measuring 5.2 x 4.9 cm. There is a cyst in the posterior mid right kidney measuring 1.8 x 1.7 cm. There is a cyst in the mid lateral mid kidney on the left measuring 2.5 x 2.3 cm. There is a probable parapelvic cyst on the left measuring 1.8 x 1.2 cm. There is no demonstrable hydronephrosis on the right. There is mild hydronephrosis on the left. There is no renal or ureteral calculus on the right. On the left, there is no intrarenal calculus. There is a calculus in the proximal left ureter just beyond the left ureteropelvic junction measuring 5 x 4 mm. No other ureteral calculi are identified. Urinary bladder is midline with wall thickness within normal limits. Stomach/Bowel: There is no bowel wall or mesenteric thickening.  No bowel obstruction. No free air or portal venous air. Vascular/Lymphatic: There is atherosclerotic calcification in the aorta and common iliac arteries. There does not appear to be major mesenteric vessel high-grade obstruction or occlusion on this noncontrast enhanced study. There is no adenopathy in the abdomen or pelvis. Reproductive: Uterus is anteverted. There is no pelvic mass or pelvic fluid collection. Other: Appendix appears normal. No abscess or ascites is evident in the abdomen or pelvis. Musculoskeletal: There is degenerative change in the lower lumbar spine. There is vacuum phenomenon at L5-S1. There are no blastic or lytic bone lesions. There  is a total hip replacement on the right. No intramuscular or abdominal wall lesion evident. IMPRESSION: 5 x 4 mm calculus in the proximal left ureter with mild hydronephrosis on the left. No bowel obstruction.  No abscess.  Appendix appears normal. There appears to be sludge in the gallbladder. The gallbladder wall does not appear thickened. Electronically Signed   By: Bretta Bang III M.D.   On: 09/23/2015 14:46    Impression/Assessment:  60 year old diabetic female with 5 mm left proximal ureteral calculus, subjective fevers, questionable UA concerning for possible infection. Her pain is also poorly controlled. As such, I recommended proceeding with cystoscopy, left ureteral stent placement. She understands the risks and benefits of the procedure. She understands that she will ultimately need definitive management of her stone and returned to the operating room. All of her questions were answered she day. Her husband is at the bedside.  Plan:  -Nothing by mouth -Consent reviewed, risks and benefits discussed -site marked on left side -Likely discharge home and from the PACU as long she remains hemodynamically stable on oral antibiotics  09/25/2015, 1:39 PM  Vanna Scotland,  MD

## 2015-09-25 NOTE — OR Nursing (Signed)
Patient discharged home patient voided large amount slight discomfort noted patient tolerated drink and crackers  Patient vss

## 2015-09-25 NOTE — ED Notes (Signed)
Patient states that she was seen on Friday and diagnosed with a Kidney stone. Patient states that last night she started running fever and having chills. Patient states that the pain medication that she was prescribed is not helping with the pain.   Patient states that the pain is in her lower back and radiates around to the front of her lower abd, patient states that she feels like she is not able to urinate as well.

## 2015-09-25 NOTE — Discharge Instructions (Signed)
You have a ureteral stent in place.  This is a tube that extends from your kidney to your bladder.  This may cause urinary bleeding, burning with urination, and urinary frequency.  Please call our office or present to the ED if you develop fevers >101 or pain which is not able to be controlled with oral pain medications.  You may be given either Flomax and/ or ditropan to help with bladder spasms and stent pain in addition to pain medications.   ° °[facility] °[address], [address] °[place], [place] [address] °[phone] ° ° ° °AMBULATORY SURGERY  °DISCHARGE INSTRUCTIONS ° ° °1) The drugs that you were given will stay in your system until tomorrow so for the next 24 hours you should not: ° °A) Drive an automobile °B) Make any legal decisions °C) Drink any alcoholic beverage ° ° °2) You may resume regular meals tomorrow.  Today it is better to start with liquids and gradually work up to solid foods. ° °You may eat anything you prefer, but it is better to start with liquids, then soup and crackers, and gradually work up to solid foods. ° ° °3) Please notify your doctor immediately if you have any unusual bleeding, trouble breathing, redness and pain at the surgery site, drainage, fever, or pain not relieved by medication. ° ° ° °4) Additional Instructions: ° ° ° ° ° ° ° °Please contact your physician with any problems or Same Day Surgery at [phone], Monday through Friday 6 am to 4 pm, or [place] [facility] [place] [facility] number at [phone]. °

## 2015-09-25 NOTE — Transfer of Care (Signed)
Immediate Anesthesia Transfer of Care Note  Patient: Rosaland LaoMarla Poyer  Procedure(s) Performed: Procedure(s): CYSTOSCOPY WITH STENT REPLACEMENT (Left)  Patient Location: PACU  Anesthesia Type:General  Level of Consciousness: awake, alert  and oriented  Airway & Oxygen Therapy: Patient connected to face mask oxygen  Post-op Assessment: Report given to RN and Post -op Vital signs reviewed and stable  Post vital signs: Reviewed and stable  Last Vitals:  Filed Vitals:   09/25/15 1339 09/25/15 1505  BP: 156/78 132/67  Pulse: 114 108  Temp:    Resp: 16 12    Last Pain:  Filed Vitals:   09/25/15 1506  PainSc: 10-Worst pain ever         Complications: No apparent anesthesia complications

## 2015-09-25 NOTE — Op Note (Signed)
Date of procedure: 09/25/2015  Preoperative diagnosis:  1. Left proximal ureteral stone 2. Possible UTI   Postoperative diagnosis:  1. Same as above   Procedure: 1. Cystoscopy 2. Left retrograde pyelogram 3. Left ureteral stent placement  Surgeon: Vanna ScotlandAshley Zygmund Passero, MD  Anesthesia: General  Complications: None  Intraoperative findings: Transition point at the proximal ureter consistent with obstructing stone at this level. Debris upon stent placement.  EBL: Minimal  Specimens: Urine culture  Drains: 6 x 22 French double-J ureteral stent on left  Indication: Rosaland LaoMarla Nethery is a 60 y.o. patient with 5 mm left proximal obstructing ureteral stone in the setting of possible infection.  After reviewing the management options for treatment, she elected to proceed with the above surgical procedure(s). We have discussed the potential benefits and risks of the procedure, side effects of the proposed treatment, the likelihood of the patient achieving the goals of the procedure, and any potential problems that might occur during the procedure or recuperation. Informed consent has been obtained.    Description of procedure:  The patient was taken to the operating room and general anesthesia was induced.  The patient was placed in the dorsal lithotomy position, prepped and draped in the usual sterile fashion, and preoperative antibiotics were administered. A preoperative time-out was performed. At this time, it was noted that laterality was not included on the transcribed consent form. It was on the consent order, site marking was confirmed, and the CT scan was confirmed to ensure the correct laterality.  A rigid 21 [place] scope was advanced per urethra into the bladder. Attention was turned to the left ureteral orifice which was cannulated using a 5 [place] open-ended ureteral catheter just within the UO. Of note, the bladder itself appeared relatively normal without evidence of any severe cystitis.  A gentle retrograde pyelogram was performed using approximate 10 cc of half diluted contrast. This revealed a transition point with proximal hydroureteronephrosis prep) mild) to level of the proximal ureter where the stone had been previously identified on CT scan. The distal ureter was decompressed. A sensor wire was then placed up to level of the kidney under fluoroscopic guidance without difficulty bypassing the stone. A 6 x 22 French double-J ureteral stent was then advanced over the wire to level of the kidney. The wire was partially drawn until full coil was noted within the renal pelvis. The wire was then fully withdrawn until full coil of stent within the bladder. The bladder was then drained. Attention was then turned to the left UO which time a significant amount of debris filled urine was seen effluxing from the left kidney. A second urine culture was sent from the bladder at this time.  She was then cleaned and dried. She was repositioned the supine position, reversed from anesthesia, taken to the PACU in stable condition. She remained hemodynamically stable throughout except for mild baseline tachycardia.   Plan: Patient will return to the office next week to discuss definitive management of her stone. She was discharged home after remaining hemodynamically stable, afebrile on Augmentin. We will adjust antibiotics as needed.  Vanna ScotlandAshley Krishang Reading, M.D.

## 2015-09-25 NOTE — Anesthesia Postprocedure Evaluation (Signed)
Anesthesia Post Note  Patient: Rosaland LaoMarla Donovan  Procedure(s) Performed: Procedure(s) (LRB): CYSTOSCOPY WITH STENT REPLACEMENT (Left)  Patient location during evaluation: PACU Anesthesia Type: General Level of consciousness: awake and alert Pain management: pain level controlled Vital Signs Assessment: post-procedure vital signs reviewed and stable Respiratory status: spontaneous breathing and respiratory function stable Cardiovascular status: stable Anesthetic complications: no    Last Vitals:  Filed Vitals:   09/25/15 1504 09/25/15 1505  BP:  132/67  Pulse: 106 108  Temp: 36.4 C   Resp: 12 12    Last Pain:  Filed Vitals:   09/25/15 1507  PainSc: 0-No pain                 KEPHART,WILLIAM K

## 2015-09-25 NOTE — ED Notes (Addendum)
Pt c/o left flank pain onset Friday,    Treated and released from here with diagnosis of kidney stone.   Pt states pain became worse this morning, and she is nauseated and unable to get any pain relief after taking the toradoll this morning.  She states that she took oxycodone yesterday with no relief.

## 2015-09-25 NOTE — Anesthesia Procedure Notes (Signed)
Procedure Name: LMA Insertion Date/Time: 09/25/2015 2:38 PM Performed by: Edmund HildaWALKER, Laiden Milles Pre-anesthesia Checklist: Patient identified, Emergency Drugs available, Suction available, Patient being monitored and Timeout performed Patient Re-evaluated:Patient Re-evaluated prior to inductionOxygen Delivery Method: Circle system utilized Preoxygenation: Pre-oxygenation with 100% oxygen Intubation Type: IV induction Ventilation: Mask ventilation without difficulty LMA Size: 3.5 Number of attempts: 1 Dental Injury: Teeth and Oropharynx as per pre-operative assessment

## 2015-09-26 LAB — GLUCOSE, CAPILLARY: GLUCOSE-CAPILLARY: 217 mg/dL — AB (ref 65–99)

## 2015-09-27 ENCOUNTER — Telehealth: Payer: Self-pay | Admitting: Urology

## 2015-09-27 LAB — URINE CULTURE: Culture: NO GROWTH

## 2015-09-27 NOTE — Telephone Encounter (Signed)
Done ° ° °Melissa Prince °

## 2015-10-07 ENCOUNTER — Telehealth: Payer: Self-pay | Admitting: Radiology

## 2015-10-07 ENCOUNTER — Ambulatory Visit (INDEPENDENT_AMBULATORY_CARE_PROVIDER_SITE_OTHER): Payer: Medicare Other | Admitting: Urology

## 2015-10-07 ENCOUNTER — Encounter: Payer: Self-pay | Admitting: Urology

## 2015-10-07 VITALS — BP 148/85 | HR 114 | Ht 61.0 in | Wt 216.9 lb

## 2015-10-07 DIAGNOSIS — N200 Calculus of kidney: Secondary | ICD-10-CM | POA: Diagnosis not present

## 2015-10-07 LAB — URINALYSIS, COMPLETE
Bilirubin, UA: NEGATIVE
GLUCOSE, UA: NEGATIVE
Nitrite, UA: NEGATIVE
Urobilinogen, Ur: 0.2 mg/dL (ref 0.2–1.0)
pH, UA: 5 (ref 5.0–7.5)

## 2015-10-07 LAB — MICROSCOPIC EXAMINATION

## 2015-10-07 NOTE — Progress Notes (Signed)
10/07/2015 2:08 PM   Melissa Prince January 04, 1956 [id number]  Referring provider: No referring provider defined for this encounter.  Chief Complaint  Patient presents with  . Advice Only    referred by Dr. Vanna ScotlandAshley Brandon for setting up surgery for stones    HPI: The patient is a 60 year old female with a past medical history of left ureteral stent placement for 5 mm proximal left renal stone presents for discussion of definitive stone management. She received a left ureteral stent on 09/25/2015. No definitive stone management was undertaken due to the fact that there is concern for infection. Her urine culture from that time to come back negative.  At this time her biggest complaint is urinary frequency and suprapubic pressure from the stent.  She has passed stones before but never required surgical intervention until this time.  PMH: Past Medical History  Diagnosis Date  . Hypertension   . Diabetes mellitus without complication (HCC)   . Angina effort (HCC)   . Kidney stone   . Anxiety   . Arthritis   . Depression   . HLD (hyperlipidemia)     Surgical History: Past Surgical History  Procedure Laterality Date  . Total hip arthroplasty Right 2009  . Cystoscopy w/ ureteral stent placement Left 09/25/2015    Procedure: CYSTOSCOPY WITH STENT REPLACEMENT and retrograde pylelogram;  Surgeon: Vanna ScotlandAshley Brandon, MD;  Location: [facility] ORS;  Service: Urology;  Laterality: Left;  . Shoulder arthroscopy Right     Home Medications:    Medication List       This list is accurate as of: 10/07/15  2:08 PM.  Always use your most recent med list.               amoxicillin-clavulanate 875-125 MG tablet  Commonly known as:  AUGMENTIN  Take 1 tablet by mouth 2 (two) times daily.     aspirin EC 81 MG tablet  Take 81 mg by mouth.     cyclobenzaprine 5 MG tablet  Commonly known as:  FLEXERIL  TAKE 1 TABLET(S) 3 TIMES A DAY BY ORAL ROUTE.     fluconazole 150 MG tablet  Commonly known  as:  DIFLUCAN  Reported on 10/07/2015     hydrocortisone 2.5 % cream  Apply topically 2 (two) times daily. to affected area     insulin aspart 100 UNIT/ML injection  Commonly known as:  novoLOG  Inject into the skin. Reported on 10/07/2015     ketorolac 10 MG tablet  Commonly known as:  TORADOL  Take 1 tablet (10 mg total) by mouth every 8 (eight) hours as needed for moderate pain (with food).     LANTUS 100 UNIT/ML injection  Generic drug:  insulin glargine  Inject into the skin. Reported on 10/07/2015     lisinopril 10 MG tablet  Commonly known as:  PRINIVIL,ZESTRIL  Take 10 mg by mouth.     metoprolol succinate 50 MG 24 hr tablet  Commonly known as:  TOPROL-XL  Take 50 mg by mouth. Reported on 10/07/2015     ondansetron 4 MG disintegrating tablet  Commonly known as:  ZOFRAN ODT  Take 1 tablet (4 mg total) by mouth every 8 (eight) hours as needed for nausea or vomiting.     oxybutynin 5 MG tablet  Commonly known as:  DITROPAN  Take 1 tablet (5 mg total) by mouth every 8 (eight) hours as needed for bladder spasms.     oxyCODONE-acetaminophen 5-325 MG tablet  Commonly known as:  ROXICET  Take 1-2 tablets by mouth every 4 (four) hours as needed for moderate pain or severe pain.     ranitidine 150 MG tablet  Commonly known as:  ZANTAC  Reported on 10/07/2015     rosuvastatin 10 MG tablet  Commonly known as:  CRESTOR  Take 10 mg by mouth. Reported on 10/07/2015     tamsulosin 0.4 MG Caps capsule  Commonly known as:  FLOMAX  Take 1 capsule (0.4 mg total) by mouth daily.     TRESIBA FLEXTOUCH 200 UNIT/ML Sopn  Generic drug:  Insulin Degludec  11/20 - INJECT 85 UNITS UNDER THE SKIN EVERY DAY        Allergies:  Allergies  Allergen Reactions  . Morphine And Related Nausea And Vomiting  . Codeine Nausea And Vomiting    Family History: Family History  Problem Relation Age of Onset  . Kidney disease Neg Hx   . Bladder Cancer Neg Hx   . Prostate cancer Brother      Social History:  reports that she has never smoked. She does not have any smokeless tobacco history on file. She reports that she does not drink alcohol or use illicit drugs.  ROS: UROLOGY Frequent Urination?: Yes Hard to postpone urination?: Yes Burning/pain with urination?: Yes Get up at night to urinate?: Yes Leakage of urine?: Yes Urine stream starts and stops?: Yes Trouble starting stream?: Yes Do you have to strain to urinate?: No Blood in urine?: Yes Urinary tract infection?: No Sexually transmitted disease?: No Injury to kidneys or bladder?: No Painful intercourse?: No Weak stream?: Yes Currently pregnant?: No Vaginal bleeding?: Yes Last menstrual period?: n  Gastrointestinal Nausea?: Yes Vomiting?: Yes Indigestion/heartburn?: No Diarrhea?: Yes Constipation?: Yes  Constitutional Fever: No Night sweats?: No Weight loss?: No Fatigue?: No  Skin Skin rash/lesions?: No Itching?: No  Eyes Blurred vision?: Yes Double vision?: No  Ears/Nose/Throat Sore throat?: No Sinus problems?: No  Hematologic/Lymphatic Swollen glands?: No Easy bruising?: Yes  Cardiovascular Leg swelling?: Yes Chest pain?: No  Respiratory Cough?: No Shortness of breath?: No  Endocrine Excessive thirst?: Yes  Musculoskeletal Back pain?: Yes Joint pain?: Yes  Neurological Headaches?: Yes Dizziness?: Yes  Psychologic Depression?: Yes Anxiety?: Yes  Physical Exam: BP 148/85 mmHg  Pulse 114  Ht 5\' 1"  (1.549 m)  Wt 216 lb 14.4 oz (98.385 kg)  BMI 41.00 kg/m2  Constitutional:  Alert and oriented, No acute distress. HEENT: [place] AT, moist mucus membranes.  Trachea midline, no masses. Cardiovascular: No clubbing, cyanosis, or edema. Respiratory: Normal respiratory effort, no increased work of breathing. GI: Abdomen is soft, nontender, nondistended, no abdominal masses GU: No CVA tenderness.  Skin: No rashes, bruises or suspicious lesions. Lymph: No cervical or  inguinal adenopathy. Neurologic: Grossly intact, no focal deficits, moving all 4 extremities. Psychiatric: Normal mood and affect.  Laboratory Data: Lab Results  Component Value Date   WBC 11.1* 09/25/2015   HGB 14.6 09/25/2015   HCT 43.9 09/25/2015   MCV 87.3 09/25/2015   PLT 177 09/25/2015    Lab Results  Component Value Date   CREATININE 1.03* 09/25/2015    No results found for: PSA  No results found for: TESTOSTERONE  Lab Results  Component Value Date   HGBA1C 9.2* 10/19/2012    Urinalysis    Component Value Date/Time   COLORURINE YELLOW* 09/25/2015 1017   APPEARANCEUR CLEAR* 09/25/2015 1017   LABSPEC 1.011 09/25/2015 1017   PHURINE 5.0 09/25/2015 1017   GLUCOSEU 150* 09/25/2015 1017  HGBUR NEGATIVE 09/25/2015 1017   BILIRUBINUR NEGATIVE 09/25/2015 1017   KETONESUR NEGATIVE 09/25/2015 1017   PROTEINUR NEGATIVE 09/25/2015 1017   NITRITE NEGATIVE 09/25/2015 1017   LEUKOCYTESUR 1+* 09/25/2015 1017    Pertinent Imaging: Study Result     CLINICAL DATA: Left-sided flank pain with nausea and vomiting  EXAM: CT ABDOMEN AND PELVIS WITHOUT CONTRAST  TECHNIQUE: Multidetector CT imaging of the abdomen and pelvis was performed following the standard protocol without oral or intravenous contrast material administration.  COMPARISON: None.  FINDINGS: Lower chest: Lung bases are clear.  Hepatobiliary: No focal liver lesions are identified on this noncontrast enhanced study. There appears to be sludge within the gallbladder. There is no gallbladder wall thickening. No biliary duct dilatation evident  Pancreas: No pancreatic mass or inflammatory focus.  Spleen: No splenic lesions are evident.  Adrenals/Urinary Tract: No adrenal lesions evident. There is a cyst arising from the posterior mid right kidney measuring 5.2 x 4.9 cm. There is a cyst in the posterior mid right kidney measuring 1.8 x 1.7 cm. There is a cyst in the mid lateral mid kidney  on the left measuring 2.5 x 2.3 cm. There is a probable parapelvic cyst on the left measuring 1.8 x 1.2 cm. There is no demonstrable hydronephrosis on the right. There is mild hydronephrosis on the left. There is no renal or ureteral calculus on the right. On the left, there is no intrarenal calculus. There is a calculus in the proximal left ureter just beyond the left ureteropelvic junction measuring 5 x 4 mm. No other ureteral calculi are identified. Urinary bladder is midline with wall thickness within normal limits.  Stomach/Bowel: There is no bowel wall or mesenteric thickening. No bowel obstruction. No free air or portal venous air.  Vascular/Lymphatic: There is atherosclerotic calcification in the aorta and common iliac arteries. There does not appear to be major mesenteric vessel high-grade obstruction or occlusion on this noncontrast enhanced study. There is no adenopathy in the abdomen or pelvis.  Reproductive: Uterus is anteverted. There is no pelvic mass or pelvic fluid collection.  Other: Appendix appears normal. No abscess or ascites is evident in the abdomen or pelvis.  Musculoskeletal: There is degenerative change in the lower lumbar spine. There is vacuum phenomenon at L5-S1. There are no blastic or lytic bone lesions. There is a total hip replacement on the right. No intramuscular or abdominal wall lesion evident.  IMPRESSION: 5 x 4 mm calculus in the proximal left ureter with mild hydronephrosis on the left.  No bowel obstruction. No abscess. Appendix appears normal.  There appears to be sludge in the gallbladder. The gallbladder wall does not appear thickened.     Assessment & Plan:    I had a long conversation with the patient described definitive stone management. This would include cystoscopy, left ureteroscopy, laser lithotripsy, left renal stent exchange. She understands the risks, benefits, and indications the procedure. She understands  the risks include but are not limited to bleeding, infection, iatrogenic ureteral injury, damage to surrounding organs. She also understands the risk for multiple procedures. All questions answered and the patient elects to proceed.  1. Left ureteral stone -cystoscopy, left ureteroscopy, laser lithotripsy, left ureteral stent exchange -Mybetriq 25 mg daily with samples for a month proceed with the patient for her stent intolerance.   No Follow-up on file.  Hildred Laser, MD  [facility] [address], [address] [place], [place] [address] [phone]

## 2015-10-07 NOTE — Telephone Encounter (Signed)
Notified pt of surgery scheduled 10/12/15, pre-admission testing appt on 5/22 @7 :30am & to call day prior to surgery for arrival time to [facility]. Advised pt per Dr Sherryl BartersBudzyn to hold ASA 81mg  beginning immediately & hold until after procedure. Pt voices understanding.

## 2015-10-10 ENCOUNTER — Encounter
Admission: RE | Admit: 2015-10-10 | Discharge: 2015-10-10 | Disposition: A | Discharge status: Home / Self Care | Payer: Medicare Other | Source: Ambulatory Visit | Attending: Urology | Admitting: Urology

## 2015-10-10 DIAGNOSIS — N132 Hydronephrosis with renal and ureteral calculous obstruction: Secondary | ICD-10-CM | POA: Diagnosis not present

## 2015-10-10 DIAGNOSIS — E669 Obesity, unspecified: Secondary | ICD-10-CM | POA: Diagnosis not present

## 2015-10-10 DIAGNOSIS — Z79899 Other long term (current) drug therapy: Secondary | ICD-10-CM | POA: Diagnosis not present

## 2015-10-10 DIAGNOSIS — R0602 Shortness of breath: Secondary | ICD-10-CM | POA: Diagnosis not present

## 2015-10-10 DIAGNOSIS — M1990 Unspecified osteoarthritis, unspecified site: Secondary | ICD-10-CM | POA: Diagnosis not present

## 2015-10-10 DIAGNOSIS — R112 Nausea with vomiting, unspecified: Secondary | ICD-10-CM | POA: Diagnosis not present

## 2015-10-10 DIAGNOSIS — Z87442 Personal history of urinary calculi: Secondary | ICD-10-CM | POA: Diagnosis not present

## 2015-10-10 DIAGNOSIS — Z96641 Presence of right artificial hip joint: Secondary | ICD-10-CM | POA: Diagnosis not present

## 2015-10-10 DIAGNOSIS — E785 Hyperlipidemia, unspecified: Secondary | ICD-10-CM | POA: Diagnosis not present

## 2015-10-10 DIAGNOSIS — E119 Type 2 diabetes mellitus without complications: Secondary | ICD-10-CM | POA: Diagnosis not present

## 2015-10-10 DIAGNOSIS — Z7982 Long term (current) use of aspirin: Secondary | ICD-10-CM | POA: Diagnosis not present

## 2015-10-10 DIAGNOSIS — I739 Peripheral vascular disease, unspecified: Secondary | ICD-10-CM | POA: Diagnosis not present

## 2015-10-10 DIAGNOSIS — R000 Tachycardia, unspecified: Secondary | ICD-10-CM | POA: Diagnosis not present

## 2015-10-10 DIAGNOSIS — I509 Heart failure, unspecified: Secondary | ICD-10-CM | POA: Diagnosis not present

## 2015-10-10 DIAGNOSIS — I208 Other forms of angina pectoris: Secondary | ICD-10-CM | POA: Diagnosis not present

## 2015-10-10 DIAGNOSIS — F419 Anxiety disorder, unspecified: Secondary | ICD-10-CM | POA: Diagnosis not present

## 2015-10-10 DIAGNOSIS — I110 Hypertensive heart disease with heart failure: Secondary | ICD-10-CM | POA: Diagnosis not present

## 2015-10-10 DIAGNOSIS — F329 Major depressive disorder, single episode, unspecified: Secondary | ICD-10-CM | POA: Diagnosis not present

## 2015-10-10 DIAGNOSIS — N201 Calculus of ureter: Secondary | ICD-10-CM | POA: Diagnosis present

## 2015-10-10 DIAGNOSIS — Z6841 Body Mass Index (BMI) 40.0 and over, adult: Secondary | ICD-10-CM | POA: Diagnosis not present

## 2015-10-10 DIAGNOSIS — Z794 Long term (current) use of insulin: Secondary | ICD-10-CM | POA: Diagnosis not present

## 2015-10-10 DIAGNOSIS — Z885 Allergy status to narcotic agent status: Secondary | ICD-10-CM | POA: Diagnosis not present

## 2015-10-10 DIAGNOSIS — Z8042 Family history of malignant neoplasm of prostate: Secondary | ICD-10-CM | POA: Diagnosis not present

## 2015-10-10 HISTORY — DX: Reserved for inherently not codable concepts without codable children: IMO0001

## 2015-10-10 HISTORY — DX: Peripheral vascular disease, unspecified: I73.9

## 2015-10-10 HISTORY — DX: Heart failure, unspecified: I50.9

## 2015-10-10 LAB — CULTURE, URINE COMPREHENSIVE

## 2015-10-10 NOTE — Patient Instructions (Addendum)
  Your procedure is scheduled on: 10/12/15 Wed Report to Same Day Surgery 2nd floor medical mall To find out your arrival time please call [phone] between 1PM - 3PM on 10/11/15 Tues Remember: Instructions that are not followed completely may result in serious medical risk, up to and including death, or upon the discretion of your surgeon and anesthesiologist your surgery may need to be rescheduled.    _x___ 1. Do not eat food or drink liquids after midnight. No gum chewing or hard candies.     ____ 2. No Alcohol for 24 hours before or after surgery.   ____ 3. Bring all medications with you on the day of surgery if instructed.    __x__ 4. Notify your doctor if there is any change in your medical condition     (cold, fever, infections).     Do not wear jewelry, make-up, hairpins, clips or nail polish.  Do not wear lotions, powders, or perfumes. You may wear deodorant.  Do not shave 48 hours prior to surgery. Men may shave face and neck.  Do not bring valuables to the hospital.    [facility] is not responsible for any belongings or valuables.               Contacts, dentures or bridgework may not be worn into surgery.  Leave your suitcase in the car. After surgery it may be brought to your room.  For patients admitted to the hospital, discharge time is determined by your treatment team.   Patients discharged the day of surgery will not be allowed to drive home.    Please read over the following fact sheets that you were given:   [facility] Preparing for [facility] and or MRSA Information   _x___ Take these medicines the morning of surgery with A SIP OF WATER:    1. oxyCODONE-acetaminophen (ROXICET) 5-325 MG tablet as needed  2.  3.  4.  5.  6.  ____ Fleet Enema (as directed)   _x___ Use CHG Soap or sage wipes as directed on instruction sheet   ____ Use inhalers on the day of surgery and bring to hospital day of surgery  ____ Stop metformin 2 days prior to  surgery    ____ Take 1/2 of usual insulin dose the night before surgery and none on the morning of           surgery.   _x___ Stop aspirin or coumadin, or plavix Stopped aspirin this past weekend  _x__ Stop Anti-inflammatories such as Advil, Aleve, Ibuprofen, Motrin, Naproxen,          Naprosyn, Goodies powders or aspirin products. Ok to take Tylenol.   ____ Stop supplements until after surgery.    ____ Bring C-Pap to the hospital.

## 2015-10-10 NOTE — Pre-Procedure Instructions (Signed)
[facility]yndey [facility]. Patient scheduled for appointment on 10/11/15.  FNP will not clear patient until after she is seen.  They will fax clearance after visit on 10/11/15.

## 2015-10-10 NOTE — Pre-Procedure Instructions (Signed)
Called Dr Henrene HawkingKephart regarding patients complaint of chest pain and SOB.  Medical clearance requested.  Called and faxed request to Amy with Dr Sherryl BartersBudzyn.  Faxed request for medical clearance to S. Margarita GrizzleWoodruff FNP.

## 2015-10-11 NOTE — Pre-Procedure Instructions (Signed)
Medical clearance on chart. 

## 2015-10-12 ENCOUNTER — Telehealth: Payer: Self-pay

## 2015-10-12 ENCOUNTER — Ambulatory Visit: Payer: Medicare Other | Admitting: Anesthesiology

## 2015-10-12 ENCOUNTER — Encounter
Admission: RE | Disposition: A | Discharge status: Home / Self Care | Payer: Self-pay | Source: Ambulatory Visit | Attending: Urology

## 2015-10-12 ENCOUNTER — Ambulatory Visit
Admission: RE | Admit: 2015-10-12 | Discharge: 2015-10-12 | Disposition: A | Discharge status: Home / Self Care | Payer: Medicare Other | Source: Ambulatory Visit | Attending: Urology | Admitting: Urology

## 2015-10-12 DIAGNOSIS — E119 Type 2 diabetes mellitus without complications: Secondary | ICD-10-CM | POA: Insufficient documentation

## 2015-10-12 DIAGNOSIS — R000 Tachycardia, unspecified: Secondary | ICD-10-CM | POA: Insufficient documentation

## 2015-10-12 DIAGNOSIS — F329 Major depressive disorder, single episode, unspecified: Secondary | ICD-10-CM | POA: Insufficient documentation

## 2015-10-12 DIAGNOSIS — I509 Heart failure, unspecified: Secondary | ICD-10-CM | POA: Insufficient documentation

## 2015-10-12 DIAGNOSIS — N132 Hydronephrosis with renal and ureteral calculous obstruction: Secondary | ICD-10-CM | POA: Insufficient documentation

## 2015-10-12 DIAGNOSIS — E785 Hyperlipidemia, unspecified: Secondary | ICD-10-CM | POA: Insufficient documentation

## 2015-10-12 DIAGNOSIS — Z79899 Other long term (current) drug therapy: Secondary | ICD-10-CM | POA: Insufficient documentation

## 2015-10-12 DIAGNOSIS — R0602 Shortness of breath: Secondary | ICD-10-CM | POA: Insufficient documentation

## 2015-10-12 DIAGNOSIS — F419 Anxiety disorder, unspecified: Secondary | ICD-10-CM | POA: Insufficient documentation

## 2015-10-12 DIAGNOSIS — I739 Peripheral vascular disease, unspecified: Secondary | ICD-10-CM | POA: Insufficient documentation

## 2015-10-12 DIAGNOSIS — E669 Obesity, unspecified: Secondary | ICD-10-CM | POA: Insufficient documentation

## 2015-10-12 DIAGNOSIS — R112 Nausea with vomiting, unspecified: Secondary | ICD-10-CM | POA: Insufficient documentation

## 2015-10-12 DIAGNOSIS — M1990 Unspecified osteoarthritis, unspecified site: Secondary | ICD-10-CM | POA: Insufficient documentation

## 2015-10-12 DIAGNOSIS — Z87442 Personal history of urinary calculi: Secondary | ICD-10-CM | POA: Insufficient documentation

## 2015-10-12 DIAGNOSIS — I110 Hypertensive heart disease with heart failure: Secondary | ICD-10-CM | POA: Insufficient documentation

## 2015-10-12 DIAGNOSIS — Z6841 Body Mass Index (BMI) 40.0 and over, adult: Secondary | ICD-10-CM | POA: Insufficient documentation

## 2015-10-12 DIAGNOSIS — Z885 Allergy status to narcotic agent status: Secondary | ICD-10-CM | POA: Insufficient documentation

## 2015-10-12 DIAGNOSIS — Z8042 Family history of malignant neoplasm of prostate: Secondary | ICD-10-CM | POA: Insufficient documentation

## 2015-10-12 DIAGNOSIS — Z794 Long term (current) use of insulin: Secondary | ICD-10-CM | POA: Insufficient documentation

## 2015-10-12 DIAGNOSIS — Z96641 Presence of right artificial hip joint: Secondary | ICD-10-CM | POA: Insufficient documentation

## 2015-10-12 DIAGNOSIS — I208 Other forms of angina pectoris: Secondary | ICD-10-CM | POA: Insufficient documentation

## 2015-10-12 DIAGNOSIS — Z7982 Long term (current) use of aspirin: Secondary | ICD-10-CM | POA: Insufficient documentation

## 2015-10-12 HISTORY — PX: CYSTOSCOPY W/ URETERAL STENT PLACEMENT: SHX1429

## 2015-10-12 HISTORY — PX: URETEROSCOPY WITH HOLMIUM LASER LITHOTRIPSY: SHX6645

## 2015-10-12 LAB — GLUCOSE, CAPILLARY
GLUCOSE-CAPILLARY: 176 mg/dL — AB (ref 65–99)
GLUCOSE-CAPILLARY: 160 mg/dL — AB (ref 65–99)

## 2015-10-12 SURGERY — URETEROSCOPY, WITH LITHOTRIPSY USING HOLMIUM LASER
Anesthesia: General | Laterality: Left | Wound class: Clean Contaminated

## 2015-10-12 MED ORDER — PROPOFOL 10 MG/ML IV BOLUS
INTRAVENOUS | Status: DC | PRN
  Administered 2015-10-12: 140 mg via INTRAVENOUS

## 2015-10-12 MED ORDER — CEFAZOLIN SODIUM-DEXTROSE 2-4 GM/100ML-% IV SOLN
2.0000 g | Freq: Once | INTRAVENOUS | Status: AC
  Administered 2015-10-12: 2 g via INTRAVENOUS

## 2015-10-12 MED ORDER — MIDAZOLAM HCL 2 MG/2ML IJ SOLN
INTRAMUSCULAR | Status: DC | PRN
  Administered 2015-10-12: 2 mg via INTRAVENOUS

## 2015-10-12 MED ORDER — FAMOTIDINE 20 MG PO TABS
ORAL_TABLET | ORAL | Status: AC
  Administered 2015-10-12: 20 mg via ORAL
  Filled 2015-10-12: qty 1

## 2015-10-12 MED ORDER — ONDANSETRON HCL 4 MG/2ML IJ SOLN: 4.0000 mg | Freq: Once | INTRAMUSCULAR | Status: DC | PRN

## 2015-10-12 MED ORDER — CEPHALEXIN 500 MG PO CAPS: 500.0000 mg | ORAL_CAPSULE | Freq: Three times a day (TID) | ORAL | Status: DC

## 2015-10-12 MED ORDER — SODIUM CHLORIDE 0.9 % IV SOLN
INTRAVENOUS | Status: DC
  Administered 2015-10-12: 11:00:00 via INTRAVENOUS

## 2015-10-12 MED ORDER — GLYCOPYRROLATE 0.2 MG/ML IJ SOLN
INTRAMUSCULAR | Status: DC | PRN
  Administered 2015-10-12: 0.2 mg via INTRAVENOUS

## 2015-10-12 MED ORDER — CEFAZOLIN SODIUM-DEXTROSE 2-4 GM/100ML-% IV SOLN
INTRAVENOUS | Status: AC
  Filled 2015-10-12: qty 100

## 2015-10-12 MED ORDER — LIDOCAINE HCL (CARDIAC) 20 MG/ML IV SOLN
INTRAVENOUS | Status: DC | PRN
  Administered 2015-10-12: 80 mg via INTRAVENOUS

## 2015-10-12 MED ORDER — OXYCODONE-ACETAMINOPHEN 5-325 MG PO TABS: 1.0000 | ORAL_TABLET | ORAL | Status: AC | PRN

## 2015-10-12 MED ORDER — FENTANYL CITRATE (PF) 100 MCG/2ML IJ SOLN
INTRAMUSCULAR | Status: DC | PRN
Start: 2015-10-12 — End: 2015-10-12
  Administered 2015-10-12 (×2): 50 ug via INTRAVENOUS
  Administered 2015-10-12: 25 ug via INTRAVENOUS
  Administered 2015-10-12: 50 ug via INTRAVENOUS

## 2015-10-12 MED ORDER — SUCCINYLCHOLINE CHLORIDE 20 MG/ML IJ SOLN
INTRAMUSCULAR | Status: DC | PRN
  Administered 2015-10-12: 100 mg via INTRAVENOUS

## 2015-10-12 MED ORDER — FAMOTIDINE 20 MG PO TABS
20.0000 mg | ORAL_TABLET | Freq: Once | ORAL | Status: AC
Start: 2015-10-12 — End: 2015-10-12
  Administered 2015-10-12: 20 mg via ORAL

## 2015-10-12 MED ORDER — PHENYLEPHRINE HCL 10 MG/ML IJ SOLN
INTRAMUSCULAR | Status: DC | PRN
  Administered 2015-10-12 (×4): 100 ug via INTRAVENOUS

## 2015-10-12 MED ORDER — ONDANSETRON HCL 4 MG/2ML IJ SOLN
INTRAMUSCULAR | Status: DC | PRN
  Administered 2015-10-12: 4 mg via INTRAVENOUS

## 2015-10-12 MED ORDER — FENTANYL CITRATE (PF) 100 MCG/2ML IJ SOLN: 25.0000 ug | INTRAMUSCULAR | Status: DC | PRN

## 2015-10-12 SURGICAL SUPPLY — 30 items
BACTOSHIELD CHG 4% 4OZ (MISCELLANEOUS) ×1
BASKET ZERO TIP 1.9FR (BASKET) IMPLANT
CATH URETL 5X70 OPEN END (CATHETERS) ×2 IMPLANT
CNTNR SPEC 2.5X3XGRAD LEK (MISCELLANEOUS) ×1
CONT SPEC 4OZ STER OR WHT (MISCELLANEOUS) ×1
CONTAINER SPEC 2.5X3XGRAD LEK (MISCELLANEOUS) ×1 IMPLANT
FEE TECHNICIAN ONLY PER HOUR (MISCELLANEOUS) IMPLANT
GLOVE BIO SURGEON STRL SZ7 (GLOVE) ×4 IMPLANT
GLOVE BIO SURGEON STRL SZ7.5 (GLOVE) ×2 IMPLANT
GOWN STRL REUS W/ TWL LRG LVL4 (GOWN DISPOSABLE) ×1 IMPLANT
GOWN STRL REUS W/TWL LRG LVL4 (GOWN DISPOSABLE) ×1
GOWN STRL REUS W/TWL XL LVL3 (GOWN DISPOSABLE) ×2 IMPLANT
GUIDEWIRE SUPER STIFF (WIRE) IMPLANT
KIT RM TURNOVER CYSTO AR (KITS) ×2 IMPLANT
LASER FIBER 200M SMARTSCOPE (Laser) IMPLANT
LASER HOLMIUM FIBER SU 272UM (MISCELLANEOUS) IMPLANT
PACK CYSTO AR (MISCELLANEOUS) ×2 IMPLANT
SCRUB CHG 4% DYNA-HEX 4OZ (MISCELLANEOUS) ×1 IMPLANT
SENSORWIRE 0.038 NOT ANGLED (WIRE) ×2
SET CYSTO W/LG BORE CLAMP LF (SET/KITS/TRAYS/PACK) ×2 IMPLANT
SHEATH URETERAL 13/15X36 1L (SHEATH) IMPLANT
SOL .9 NS 3000ML IRR  AL (IV SOLUTION) ×1
SOL .9 NS 3000ML IRR UROMATIC (IV SOLUTION) ×1 IMPLANT
STENT URET 6FRX22 CONTOUR (STENTS) ×2 IMPLANT
STENT URET 6FRX24 CONTOUR (STENTS) IMPLANT
STENT URET 6FRX26 CONTOUR (STENTS) IMPLANT
SURGILUBE 2OZ TUBE FLIPTOP (MISCELLANEOUS) ×2 IMPLANT
SYRINGE IRR TOOMEY STRL 70CC (SYRINGE) ×2 IMPLANT
WATER STERILE IRR 1000ML POUR (IV SOLUTION) ×2 IMPLANT
WIRE SENSOR 0.038 NOT ANGLED (WIRE) ×1 IMPLANT

## 2015-10-12 NOTE — Discharge Instructions (Signed)

## 2015-10-12 NOTE — Telephone Encounter (Signed)
-----   Message from Hildred LaserBrian James Budzyn, MD sent at 10/12/2015 12:58 PM EDT ----- Patient needs to see me in one week for cysto/stent removal in the office. Thanks.

## 2015-10-12 NOTE — Anesthesia Procedure Notes (Signed)
Procedure Name: Intubation Performed by: Malva CoganBEANE, Matha Masse Pre-anesthesia Checklist: Patient identified, Patient being monitored, Timeout performed, Emergency Drugs available and Suction available Patient Re-evaluated:Patient Re-evaluated prior to inductionOxygen Delivery Method: Circle system utilized Preoxygenation: Pre-oxygenation with 100% oxygen Intubation Type: IV induction, Cricoid Pressure applied and Rapid sequence Laryngoscope Size: Mac and 3 Grade View: Grade I Tube type: Oral Tube size: 7.5 mm Number of attempts: 1 Airway Equipment and Method: Stylet Placement Confirmation: ETT inserted through vocal cords under direct vision,  positive ETCO2 and breath sounds checked- equal and bilateral Secured at: 21 cm Tube secured with: Tape Dental Injury: Teeth and Oropharynx as per pre-operative assessment

## 2015-10-12 NOTE — Op Note (Signed)
Date of procedure: 10/12/2015  Preoperative diagnosis:  1. Left ureteral stone  Postoperative diagnosis:  1. Left ureteral stone   Procedure: 1. Cystoscopy 2. Left ureteroscopy 3. Stone basketing 4. Left retrograde pyelogram with interpretation 5. Left ureteral stent exchange 6 [place] by 22 cm  Surgeon: Baruch Gouty, MD  Anesthesia: General  Complications: None  Intraoperative findings: The patient had a proximal 5 mm left ureteral stone that was stone basketed and removed intact. Left retrograde pyelogram at the end the procedure showed no residual stone fragment. The stent was confirmed to be in the correct location with fluoroscopy  EBL: None  Specimens: Left ureteral stone  Drains: 6 French by 22 cm left double-J ureteral stent  Disposition: Stable to the postanesthesia care unit  Indication for procedure: The patient is a 60 y.o. female with a proximal left ureteral stone status post stent placement presents today for definitive stone management.  After reviewing the management options for treatment, the patient elected to proceed with the above surgical procedure(s). We have discussed the potential benefits and risks of the procedure, side effects of the proposed treatment, the likelihood of the patient achieving the goals of the procedure, and any potential problems that might occur during the procedure or recuperation. Informed consent has been obtained.  Description of procedure: The patient was met in the preoperative area. All risks, benefits, and indications of the procedure were described in great detail. The patient consented to the procedure. Preoperative antibiotics were given. The patient was taken to the operative theater. General anesthesia was induced per the anesthesia service. The patient was then placed in the dorsal lithotomy position and prepped and draped in the usual sterile fashion. A preoperative timeout was called.   A 21 French 30 cystoscope was  inserted into the patient's bladder per urethra atraumatically. The left renal stent was grasped and brought to level urethral meatus with flexible graspers. A sensor wire was exchanged the stent up to level of the renal pelvis under fluoroscopy. The stent was removed. Semirigid ureteroscope was then assembled and inserted in the patient's bladder and then the left ureteral orifice atraumatically. Pan ureteroscopy revealed a 5 mm stone in the proximal ureter. This was grasped with stone basket and removed intact with no trauma to the ureter. Pan ureteroscopy this time was unremarkable. There is no residual stone fragments. A left retrograde pyelogram was obtained through the ureteroscope. It showedfilling defects. There was delayed drainage of contrast so decision was made to leave a ureteral stent without a string. The cystoscope was then reassembled over the sensor wire. A 6 French by 20 cm double-J ureteral stent was placed in the left and the sensor wires removed. Discomfort in the correct location across the patient's left renal pelvis on fluoroscopy reveals the patient's urinary bladder under positions. There is immediate drainage of contrast from the stent. The patient's bladder was then drained. She'll anesthesia and transferred stable condition to the postanesthesia care unit.   Plan: The patient will follow-up in one week for cystoscopy with stent removal in the office. She will need a renal ultrasound in 1 month to rule out iatrogenic hydronephrosis.   Baruch Gouty, M.D.

## 2015-10-12 NOTE — H&P (View-Only) (Signed)
10/07/2015 2:08 PM   Melissa Prince January 04, 1956 [id number]  Referring provider: No referring provider defined for this encounter.  Chief Complaint  Patient presents with  . Advice Only    referred by Dr. Vanna ScotlandAshley Brandon for setting up surgery for stones    HPI: The patient is a 60 year old female with a past medical history of left ureteral stent placement for 5 mm proximal left renal stone presents for discussion of definitive stone management. She received a left ureteral stent on 09/25/2015. No definitive stone management was undertaken due to the fact that there is concern for infection. Her urine culture from that time to come back negative.  At this time her biggest complaint is urinary frequency and suprapubic pressure from the stent.  She has passed stones before but never required surgical intervention until this time.  PMH: Past Medical History  Diagnosis Date  . Hypertension   . Diabetes mellitus without complication (HCC)   . Angina effort (HCC)   . Kidney stone   . Anxiety   . Arthritis   . Depression   . HLD (hyperlipidemia)     Surgical History: Past Surgical History  Procedure Laterality Date  . Total hip arthroplasty Right 2009  . Cystoscopy w/ ureteral stent placement Left 09/25/2015    Procedure: CYSTOSCOPY WITH STENT REPLACEMENT and retrograde pylelogram;  Surgeon: Vanna ScotlandAshley Brandon, MD;  Location: [facility] ORS;  Service: Urology;  Laterality: Left;  . Shoulder arthroscopy Right     Home Medications:    Medication List       This list is accurate as of: 10/07/15  2:08 PM.  Always use your most recent med list.               amoxicillin-clavulanate 875-125 MG tablet  Commonly known as:  AUGMENTIN  Take 1 tablet by mouth 2 (two) times daily.     aspirin EC 81 MG tablet  Take 81 mg by mouth.     cyclobenzaprine 5 MG tablet  Commonly known as:  FLEXERIL  TAKE 1 TABLET(S) 3 TIMES A DAY BY ORAL ROUTE.     fluconazole 150 MG tablet  Commonly known  as:  DIFLUCAN  Reported on 10/07/2015     hydrocortisone 2.5 % cream  Apply topically 2 (two) times daily. to affected area     insulin aspart 100 UNIT/ML injection  Commonly known as:  novoLOG  Inject into the skin. Reported on 10/07/2015     ketorolac 10 MG tablet  Commonly known as:  TORADOL  Take 1 tablet (10 mg total) by mouth every 8 (eight) hours as needed for moderate pain (with food).     LANTUS 100 UNIT/ML injection  Generic drug:  insulin glargine  Inject into the skin. Reported on 10/07/2015     lisinopril 10 MG tablet  Commonly known as:  PRINIVIL,ZESTRIL  Take 10 mg by mouth.     metoprolol succinate 50 MG 24 hr tablet  Commonly known as:  TOPROL-XL  Take 50 mg by mouth. Reported on 10/07/2015     ondansetron 4 MG disintegrating tablet  Commonly known as:  ZOFRAN ODT  Take 1 tablet (4 mg total) by mouth every 8 (eight) hours as needed for nausea or vomiting.     oxybutynin 5 MG tablet  Commonly known as:  DITROPAN  Take 1 tablet (5 mg total) by mouth every 8 (eight) hours as needed for bladder spasms.     oxyCODONE-acetaminophen 5-325 MG tablet  Commonly known as:  ROXICET  Take 1-2 tablets by mouth every 4 (four) hours as needed for moderate pain or severe pain.     ranitidine 150 MG tablet  Commonly known as:  ZANTAC  Reported on 10/07/2015     rosuvastatin 10 MG tablet  Commonly known as:  CRESTOR  Take 10 mg by mouth. Reported on 10/07/2015     tamsulosin 0.4 MG Caps capsule  Commonly known as:  FLOMAX  Take 1 capsule (0.4 mg total) by mouth daily.     TRESIBA FLEXTOUCH 200 UNIT/ML Sopn  Generic drug:  Insulin Degludec  11/20 - INJECT 85 UNITS UNDER THE SKIN EVERY DAY        Allergies:  Allergies  Allergen Reactions  . Morphine And Related Nausea And Vomiting  . Codeine Nausea And Vomiting    Family History: Family History  Problem Relation Age of Onset  . Kidney disease Neg Hx   . Bladder Cancer Neg Hx   . Prostate cancer Brother      Social History:  reports that she has never smoked. She does not have any smokeless tobacco history on file. She reports that she does not drink alcohol or use illicit drugs.  ROS: UROLOGY Frequent Urination?: Yes Hard to postpone urination?: Yes Burning/pain with urination?: Yes Get up at night to urinate?: Yes Leakage of urine?: Yes Urine stream starts and stops?: Yes Trouble starting stream?: Yes Do you have to strain to urinate?: No Blood in urine?: Yes Urinary tract infection?: No Sexually transmitted disease?: No Injury to kidneys or bladder?: No Painful intercourse?: No Weak stream?: Yes Currently pregnant?: No Vaginal bleeding?: Yes Last menstrual period?: n  Gastrointestinal Nausea?: Yes Vomiting?: Yes Indigestion/heartburn?: No Diarrhea?: Yes Constipation?: Yes  Constitutional Fever: No Night sweats?: No Weight loss?: No Fatigue?: No  Skin Skin rash/lesions?: No Itching?: No  Eyes Blurred vision?: Yes Double vision?: No  Ears/Nose/Throat Sore throat?: No Sinus problems?: No  Hematologic/Lymphatic Swollen glands?: No Easy bruising?: Yes  Cardiovascular Leg swelling?: Yes Chest pain?: No  Respiratory Cough?: No Shortness of breath?: No  Endocrine Excessive thirst?: Yes  Musculoskeletal Back pain?: Yes Joint pain?: Yes  Neurological Headaches?: Yes Dizziness?: Yes  Psychologic Depression?: Yes Anxiety?: Yes  Physical Exam: BP 148/85 mmHg  Pulse 114  Ht 5\' 1"  (1.549 m)  Wt 216 lb 14.4 oz (98.385 kg)  BMI 41.00 kg/m2  Constitutional:  Alert and oriented, No acute distress. HEENT: [place] AT, moist mucus membranes.  Trachea midline, no masses. Cardiovascular: No clubbing, cyanosis, or edema. Respiratory: Normal respiratory effort, no increased work of breathing. GI: Abdomen is soft, nontender, nondistended, no abdominal masses GU: No CVA tenderness.  Skin: No rashes, bruises or suspicious lesions. Lymph: No cervical or  inguinal adenopathy. Neurologic: Grossly intact, no focal deficits, moving all 4 extremities. Psychiatric: Normal mood and affect.  Laboratory Data: Lab Results  Component Value Date   WBC 11.1* 09/25/2015   HGB 14.6 09/25/2015   HCT 43.9 09/25/2015   MCV 87.3 09/25/2015   PLT 177 09/25/2015    Lab Results  Component Value Date   CREATININE 1.03* 09/25/2015    No results found for: PSA  No results found for: TESTOSTERONE  Lab Results  Component Value Date   HGBA1C 9.2* 10/19/2012    Urinalysis    Component Value Date/Time   COLORURINE YELLOW* 09/25/2015 1017   APPEARANCEUR CLEAR* 09/25/2015 1017   LABSPEC 1.011 09/25/2015 1017   PHURINE 5.0 09/25/2015 1017   GLUCOSEU 150* 09/25/2015 1017  HGBUR NEGATIVE 09/25/2015 1017   BILIRUBINUR NEGATIVE 09/25/2015 1017   KETONESUR NEGATIVE 09/25/2015 1017   PROTEINUR NEGATIVE 09/25/2015 1017   NITRITE NEGATIVE 09/25/2015 1017   LEUKOCYTESUR 1+* 09/25/2015 1017    Pertinent Imaging: Study Result     CLINICAL DATA: Left-sided flank pain with nausea and vomiting  EXAM: CT ABDOMEN AND PELVIS WITHOUT CONTRAST  TECHNIQUE: Multidetector CT imaging of the abdomen and pelvis was performed following the standard protocol without oral or intravenous contrast material administration.  COMPARISON: None.  FINDINGS: Lower chest: Lung bases are clear.  Hepatobiliary: No focal liver lesions are identified on this noncontrast enhanced study. There appears to be sludge within the gallbladder. There is no gallbladder wall thickening. No biliary duct dilatation evident  Pancreas: No pancreatic mass or inflammatory focus.  Spleen: No splenic lesions are evident.  Adrenals/Urinary Tract: No adrenal lesions evident. There is a cyst arising from the posterior mid right kidney measuring 5.2 x 4.9 cm. There is a cyst in the posterior mid right kidney measuring 1.8 x 1.7 cm. There is a cyst in the mid lateral mid kidney  on the left measuring 2.5 x 2.3 cm. There is a probable parapelvic cyst on the left measuring 1.8 x 1.2 cm. There is no demonstrable hydronephrosis on the right. There is mild hydronephrosis on the left. There is no renal or ureteral calculus on the right. On the left, there is no intrarenal calculus. There is a calculus in the proximal left ureter just beyond the left ureteropelvic junction measuring 5 x 4 mm. No other ureteral calculi are identified. Urinary bladder is midline with wall thickness within normal limits.  Stomach/Bowel: There is no bowel wall or mesenteric thickening. No bowel obstruction. No free air or portal venous air.  Vascular/Lymphatic: There is atherosclerotic calcification in the aorta and common iliac arteries. There does not appear to be major mesenteric vessel high-grade obstruction or occlusion on this noncontrast enhanced study. There is no adenopathy in the abdomen or pelvis.  Reproductive: Uterus is anteverted. There is no pelvic mass or pelvic fluid collection.  Other: Appendix appears normal. No abscess or ascites is evident in the abdomen or pelvis.  Musculoskeletal: There is degenerative change in the lower lumbar spine. There is vacuum phenomenon at L5-S1. There are no blastic or lytic bone lesions. There is a total hip replacement on the right. No intramuscular or abdominal wall lesion evident.  IMPRESSION: 5 x 4 mm calculus in the proximal left ureter with mild hydronephrosis on the left.  No bowel obstruction. No abscess. Appendix appears normal.  There appears to be sludge in the gallbladder. The gallbladder wall does not appear thickened.     Assessment & Plan:    I had a long conversation with the patient described definitive stone management. This would include cystoscopy, left ureteroscopy, laser lithotripsy, left renal stent exchange. She understands the risks, benefits, and indications the procedure. She understands  the risks include but are not limited to bleeding, infection, iatrogenic ureteral injury, damage to surrounding organs. She also understands the risk for multiple procedures. All questions answered and the patient elects to proceed.  1. Left ureteral stone -cystoscopy, left ureteroscopy, laser lithotripsy, left ureteral stent exchange -Mybetriq 25 mg daily with samples for a month proceed with the patient for her stent intolerance.   No Follow-up on file.  Hildred Laser, MD  [facility] [address], [address] [place], [place] [address] [phone]

## 2015-10-12 NOTE — Interval H&P Note (Signed)
History and Physical Interval Note:  10/12/2015 11:58 AM  Melissa Prince  has presented today for surgery, with the diagnosis of left ureteral stone  The various methods of treatment have been discussed with the patient and family. After consideration of risks, benefits and other options for treatment, the patient has consented to  Procedure(s): URETEROSCOPY WITH HOLMIUM LASER LITHOTRIPSY (Left) CYSTOSCOPY WITH STENT REPLACEMENT (Left) as a surgical intervention .  The patient's history has been reviewed, patient examined, no change in status, stable for surgery.  I have reviewed the patient's chart and labs.  Questions were answered to the patient's satisfaction.    RRR Unlabored resp  Hildred LaserBrian James Kenzli Barritt

## 2015-10-12 NOTE — Anesthesia Preprocedure Evaluation (Signed)
Anesthesia Evaluation  Patient identified by MRN, date of birth, ID band Patient awake    Reviewed: Allergy & Precautions, NPO status , Patient's Chart, lab work & pertinent test results, reviewed documented beta blocker date and time   Airway Mallampati: III  TM Distance: >3 FB     Dental  (+) Chipped   Pulmonary shortness of breath,           Cardiovascular hypertension, Pt. on medications + angina + Peripheral Vascular Disease and +CHF       Neuro/Psych PSYCHIATRIC DISORDERS Anxiety Depression    GI/Hepatic   Endo/Other  diabetes, Type 2  Renal/GU Renal InsufficiencyRenal disease     Musculoskeletal  (+) Arthritis ,   Abdominal   Peds  Hematology   Anesthesia Other Findings Obese. Hypertensive,tachycardic.  Reproductive/Obstetrics                             Anesthesia Physical Anesthesia Plan  ASA: III  Anesthesia Plan: General   Post-op Pain Management:    Induction: Intravenous  Airway Management Planned: Oral ETT and LMA  Additional Equipment:   Intra-op Plan:   Post-operative Plan:   Informed Consent: I have reviewed the patients History and Physical, chart, labs and discussed the procedure including the risks, benefits and alternatives for the proposed anesthesia with the patient or authorized representative who has indicated his/her understanding and acceptance.     Plan Discussed with: CRNA  Anesthesia Plan Comments:         Anesthesia Quick Evaluation

## 2015-10-12 NOTE — Anesthesia Postprocedure Evaluation (Signed)
Anesthesia Post Note  Patient: Rosaland LaoMarla Metayer  Procedure(s) Performed: Procedure(s) (LRB): URETEROSCOPY WITH STONE BASKETING (Left) CYSTOSCOPY WITH STENT REPLACEMENT (Left)  Patient location during evaluation: PACU Anesthesia Type: General Level of consciousness: awake and alert Pain management: pain level controlled Vital Signs Assessment: post-procedure vital signs reviewed and stable Respiratory status: spontaneous breathing, nonlabored ventilation, respiratory function stable and patient connected to nasal cannula oxygen Cardiovascular status: blood pressure returned to baseline and stable Postop Assessment: no signs of nausea or vomiting Anesthetic complications: no    Last Vitals:  Filed Vitals:   10/12/15 1343 10/12/15 1404  BP: 139/77 168/86  Pulse: 96 92  Temp: 36.9 C 36.8 C  Resp: 17 18    Last Pain:  Filed Vitals:   10/12/15 1408  PainSc: 0-No pain                 Katisha Shimizu S

## 2015-10-12 NOTE — Telephone Encounter (Signed)
We had to put with dr. Apolinar JunesBrandon since [place] not [place] anything in a week sarah ok'd this   BelcherMichelle

## 2015-10-12 NOTE — Transfer of Care (Signed)
Immediate Anesthesia Transfer of Care Note  Patient: Melissa Prince  Procedure(s) Performed: Procedure(s): URETEROSCOPY WITH STONE BASKETING (Left) CYSTOSCOPY WITH STENT REPLACEMENT (Left)  Patient Location: PACU  Anesthesia Type:General  Level of Consciousness: awake and responds to stimulation  Airway & Oxygen Therapy: Patient Spontanous Breathing and Patient connected to face mask oxygen  Post-op Assessment: Report given to RN and Post -op Vital signs reviewed and stable  Post vital signs: Reviewed and stable  Last Vitals:  Filed Vitals:   10/12/15 1108 10/12/15 1258  BP: 161/84 150/79  Pulse: 74 109  Temp: 36.8 C 36.6 C  Resp: 20 16    Last Pain:  Filed Vitals:   10/12/15 1300  PainSc: 6       Patients Stated Pain Goal: 0 (10/12/15 1108)  Complications: No apparent anesthesia complications

## 2015-10-20 LAB — STONE ANALYSIS
STONE WEIGHT KSTONE: 64 mg
Uric Acid: 100 %

## 2015-10-21 ENCOUNTER — Encounter: Payer: Self-pay | Admitting: Urology

## 2015-10-21 ENCOUNTER — Ambulatory Visit (INDEPENDENT_AMBULATORY_CARE_PROVIDER_SITE_OTHER): Payer: Medicare Other | Admitting: Urology

## 2015-10-21 VITALS — BP 195/100 | HR 120 | Ht 61.0 in | Wt 218.6 lb

## 2015-10-21 DIAGNOSIS — N200 Calculus of kidney: Secondary | ICD-10-CM | POA: Diagnosis not present

## 2015-10-21 LAB — URINALYSIS, COMPLETE
Bilirubin, UA: NEGATIVE
Glucose, UA: NEGATIVE
KETONES UA: NEGATIVE
NITRITE UA: NEGATIVE
PH UA: 5.5 (ref 5.0–7.5)
SPEC GRAV UA: 1.01 (ref 1.005–1.030)
Urobilinogen, Ur: 0.2 mg/dL (ref 0.2–1.0)

## 2015-10-21 LAB — MICROSCOPIC EXAMINATION: BACTERIA UA: NONE SEEN

## 2015-10-21 MED ORDER — CIPROFLOXACIN HCL 500 MG PO TABS
500.0000 mg | ORAL_TABLET | Freq: Once | ORAL | Status: AC
  Administered 2015-10-21: 500 mg via ORAL

## 2015-10-21 MED ORDER — LIDOCAINE HCL 2 % EX GEL
1.0000 "application " | Freq: Once | CUTANEOUS | Status: AC
  Administered 2015-10-21: 1 via URETHRAL

## 2015-10-21 NOTE — Progress Notes (Signed)
10/21/2015  CC: Stent removal  HPI:  60 year old female who underwent left ureteroscopy, laser lithotripsy on 10/12/2015 by Dr. Hadley PenBrian Budzyn. She presents today for cystoscopy, stent removal.  Cystoscopy/ Stent removal procedure  Patient identification was confirmed, informed consent was obtained, and patient was prepped using Betadine solution.  Lidocaine jelly was administered per urethral meatus.    Preoperative abx where received prior to procedure.    Procedure: - Flexible cystoscope introduced, without any difficulty.   - Thorough search of the bladder revealed:    normal urethral meatus  Stent seen emanating from left ureteral orifice, grasped with stent graspers, and removed in entirety.    Post-Procedure: - Patient tolerated the procedure well  Assessment/ Plan:  1. Kidney stones S/p uncomplicated stent removal today Warning symptoms reviewed in detail Follow-up with Dr. Sherryl BartersBudzyn in 4 weeks with renal ultrasound prior - Urinalysis, Complete - US Renal; Future

## 2015-10-21 NOTE — Addendum Note (Signed)
Addended by: Rupert StacksWATKINS, CHELSEA C on: 10/21/2015 01:04 PM   Modules accepted: Orders

## 2015-11-18 ENCOUNTER — Ambulatory Visit: Payer: Medicare Other

## 2015-11-24 ENCOUNTER — Ambulatory Visit: Payer: Medicare Other

## 2015-12-02 ENCOUNTER — Ambulatory Visit: Payer: Medicare Other

## 2015-12-16 ENCOUNTER — Ambulatory Visit: Payer: Medicare Other

## 2017-08-18 ENCOUNTER — Other Ambulatory Visit: Payer: Self-pay

## 2017-08-18 ENCOUNTER — Emergency Department
Admission: EM | Admit: 2017-08-18 | Discharge: 2017-08-18 | Disposition: A | Discharge status: Home / Self Care | Payer: Medicare Other | Attending: Emergency Medicine | Admitting: Emergency Medicine

## 2017-08-18 ENCOUNTER — Emergency Department: Payer: Medicare Other

## 2017-08-18 ENCOUNTER — Encounter: Payer: Self-pay | Admitting: Emergency Medicine

## 2017-08-18 DIAGNOSIS — I110 Hypertensive heart disease with heart failure: Secondary | ICD-10-CM | POA: Diagnosis not present

## 2017-08-18 DIAGNOSIS — M549 Dorsalgia, unspecified: Secondary | ICD-10-CM | POA: Diagnosis not present

## 2017-08-18 DIAGNOSIS — R1031 Right lower quadrant pain: Secondary | ICD-10-CM | POA: Insufficient documentation

## 2017-08-18 DIAGNOSIS — Z79899 Other long term (current) drug therapy: Secondary | ICD-10-CM | POA: Diagnosis not present

## 2017-08-18 DIAGNOSIS — Z96641 Presence of right artificial hip joint: Secondary | ICD-10-CM | POA: Insufficient documentation

## 2017-08-18 DIAGNOSIS — I509 Heart failure, unspecified: Secondary | ICD-10-CM | POA: Diagnosis not present

## 2017-08-18 DIAGNOSIS — Z7982 Long term (current) use of aspirin: Secondary | ICD-10-CM | POA: Insufficient documentation

## 2017-08-18 DIAGNOSIS — E119 Type 2 diabetes mellitus without complications: Secondary | ICD-10-CM | POA: Insufficient documentation

## 2017-08-18 LAB — CBC
HCT: 46.8 % (ref 35.0–47.0)
HEMOGLOBIN: 15.8 g/dL (ref 12.0–16.0)
MCH: 29.6 pg (ref 26.0–34.0)
MCHC: 33.9 g/dL (ref 32.0–36.0)
MCV: 87.5 fL (ref 80.0–100.0)
PLATELETS: 177 10*3/uL (ref 150–440)
RBC: 5.35 MIL/uL — AB (ref 3.80–5.20)
RDW: 13.6 % (ref 11.5–14.5)
WBC: 8 10*3/uL (ref 3.6–11.0)

## 2017-08-18 LAB — BASIC METABOLIC PANEL
ANION GAP: 8 (ref 5–15)
BUN: 17 mg/dL (ref 6–20)
CO2: 28 mmol/L (ref 22–32)
CREATININE: 0.73 mg/dL (ref 0.44–1.00)
Calcium: 8.9 mg/dL (ref 8.9–10.3)
Chloride: 103 mmol/L (ref 101–111)
Glucose, Bld: 185 mg/dL — ABNORMAL HIGH (ref 65–99)
Potassium: 3.9 mmol/L (ref 3.5–5.1)
SODIUM: 139 mmol/L (ref 135–145)

## 2017-08-18 MED ORDER — OXYCODONE-ACETAMINOPHEN 5-325 MG PO TABS
1.0000 | ORAL_TABLET | Freq: Once | ORAL | Status: AC
  Administered 2017-08-18: 1 via ORAL

## 2017-08-18 MED ORDER — ONDANSETRON 4 MG PO TBDP
4.0000 mg | ORAL_TABLET | Freq: Once | ORAL | Status: AC
  Administered 2017-08-18: 4 mg via ORAL

## 2017-08-18 MED ORDER — OXYCODONE-ACETAMINOPHEN 5-325 MG PO TABS
ORAL_TABLET | ORAL | Status: AC
  Administered 2017-08-18: 1 via ORAL
  Filled 2017-08-18: qty 1

## 2017-08-18 MED ORDER — PROMETHAZINE HCL 25 MG PO TABS: 25.0000 mg | ORAL_TABLET | Freq: Four times a day (QID) | ORAL | 0 refills | Status: AC | PRN

## 2017-08-18 MED ORDER — ONDANSETRON HCL 4 MG/2ML IJ SOLN: 4.0000 mg | Freq: Once | INTRAMUSCULAR | Status: DC

## 2017-08-18 MED ORDER — ONDANSETRON 4 MG PO TBDP
ORAL_TABLET | ORAL | Status: AC
  Administered 2017-08-18: 4 mg via ORAL
  Filled 2017-08-18: qty 1

## 2017-08-18 MED ORDER — OXYCODONE HCL 5 MG PO TABS: 5.0000 mg | ORAL_TABLET | Freq: Three times a day (TID) | ORAL | 0 refills | Status: AC | PRN

## 2017-08-18 MED ORDER — HYDROMORPHONE HCL 1 MG/ML IJ SOLN: 0.5000 mg | Freq: Once | INTRAMUSCULAR | Status: DC

## 2017-08-18 NOTE — ED Provider Notes (Signed)
[facility] Emergency Department Provider Note   ____________________________________________   First MD Initiated Contact with Patient 08/18/17 1330     (approximate)  I have reviewed the triage vital signs and the nursing notes.   HISTORY  Chief Complaint Flank Pain    HPI Rosaland LaoMarla Heiny is a 62 y.o. female Patient complains of 2-3 days of back pain radiating down into the right leg in the front. She vomited once last night. She has a history of kidney stones feels something like this and she also has muscle spasms and feels something like this but what usually works for her muscle spasms isn't working. She had emergency surgery for an infected stone about 6 months ago. The present time the pain is not too bad it's worse with movement. Blood pressure is very high.   Past Medical History:  Diagnosis Date  . Angina effort (HCC)   . Anxiety   . Arthritis   . CHF (congestive heart failure) (HCC)   . Depression   . Diabetes mellitus without complication (HCC)   . HLD (hyperlipidemia)   . Hypertension   . Kidney stone   . PVD (peripheral vascular disease) (HCC)    Worse on Rt leg  . Shortness of breath dyspnea     There are no active problems to display for this patient.   Past Surgical History:  Procedure Laterality Date  . CYSTOSCOPY W/ URETERAL STENT PLACEMENT Left 09/25/2015   Procedure: CYSTOSCOPY WITH STENT REPLACEMENT and retrograde pylelogram;  Surgeon: Vanna ScotlandAshley Brandon, MD;  Location: [facility] ORS;  Service: Urology;  Laterality: Left;  . CYSTOSCOPY W/ URETERAL STENT PLACEMENT Left 10/12/2015   Procedure: CYSTOSCOPY WITH STENT REPLACEMENT;  Surgeon: Hildred LaserBrian James Budzyn, MD;  Location: [facility] ORS;  Service: Urology;  Laterality: Left;  . JOINT REPLACEMENT     Hip replacement Rt  . SHOULDER ARTHROSCOPY Right   . TOTAL HIP ARTHROPLASTY Right 2009  . URETEROSCOPY WITH HOLMIUM LASER LITHOTRIPSY Left 10/12/2015   Procedure: URETEROSCOPY WITH STONE  BASKETING;  Surgeon: Hildred LaserBrian James Budzyn, MD;  Location: [facility] ORS;  Service: Urology;  Laterality: Left;    Prior to Admission medications   Medication Sig Start Date End Date Taking? Authorizing Provider  aspirin EC 81 MG tablet Take 81 mg by mouth.    [provider]  cyclobenzaprine (FLEXERIL) 5 MG tablet TAKE 1 TABLET(S) 3 TIMES A DAY BY ORAL ROUTE. 08/17/15   [provider]  hydrocortisone 2.5 % cream Apply topically 2 (two) times daily. to affected area 07/21/15   [provider]  ketorolac (TORADOL) 10 MG tablet Take 1 tablet (10 mg total) by mouth every 8 (eight) hours as needed for moderate pain (with food). 09/23/15   Rockne MenghiniNorman, Anne-Caroline, MD  lisinopril-hydrochlorothiazide (PRINZIDE,ZESTORETIC) 20-25 MG tablet Take 1 tablet by mouth daily.    [provider]  mirabegron ER (MYRBETRIQ) 25 MG TB24 tablet Take 25 mg by mouth daily. Reported on 10/21/2015    [provider]  ondansetron (ZOFRAN ODT) 4 MG disintegrating tablet Take 1 tablet (4 mg total) by mouth every 8 (eight) hours as needed for nausea or vomiting. 09/25/15   Vanna ScotlandBrandon, Ashley, MD  ranitidine (ZANTAC) 150 MG tablet Reported on 10/07/2015 07/13/15   [provider]  TRESIBA FLEXTOUCH 200 UNIT/ML SOPN 11/20 - INJECT 86 UNITS UNDER THE SKIN EVERY DAY 08/16/15   [provider]    Allergies Morphine and related and Codeine  Family History  Problem Relation Age of Onset  .  Prostate cancer Brother   . Kidney disease Neg Hx   . Bladder Cancer Neg Hx     Social History Social History   Tobacco Use  . Smoking status: Never Smoker  Substance Use Topics  . Alcohol use: No  . Drug use: No    Review of Systems  Constitutional: No fever/chills Eyes: No visual changes. ENT: No sore throat. Cardiovascular: Denies chest pain. Respiratory: Denies shortness of breath. Gastrointestinal: see history of present illnessn. Genitourinary: Negative for  dysuria. Musculoskeletal:see history of present illness Skin: Negative for rash. Neurological: Negative for headaches, focal weakness   ____________________________________________   PHYSICAL EXAM:  VITAL SIGNS: ED Triage Vitals  Enc Vitals Group     BP 08/18/17 1154 (!) 216/97     Pulse Rate 08/18/17 1153 (!) 110     Resp 08/18/17 1153 20     Temp 08/18/17 1153 98.7 F (37.1 C)     Temp Source 08/18/17 1153 Oral     SpO2 08/18/17 1153 97 %     Weight 08/18/17 1152 232 lb (105.2 kg)     Height 08/18/17 1152 5\' 1"  (1.549 m)     Head Circumference --      Peak Flow --      Pain Score 08/18/17 1152 10     Pain Loc --      Pain Edu? --      Excl. in GC? --     Constitutional: Alert and oriented. Well appearing and in no acute distress. Eyes: Conjunctivae are normal.  Head: Atraumatic. Nose: No congestion/rhinnorhea. Mouth/Throat: Mucous membranes are moist.  Oropharynx non-erythematous. Neck: No stridor.  Cardiovascular: Normal rate, regular rhythm. Grossly normal heart sounds.  Good peripheral circulation. Respiratory: Normal respiratory effort.  No retractions. Lungs CTAB. Gastrointestinal: Soft and nontender. No distention. No abdominal bruits. No CVA tenderness. Musculoskeletal: No lower extremity tenderness nor edema.  No joint effusions.straight leg raise is negative. There is some pain at about L2 palpation Neurologic:  Normal speech and language. No gross focal neurologic deficits are appreciated.  Skin:  Skin is warm, dry and intact. No rash noted. Psychiatric: Mood and affect are normal. Speech and behavior are normal.  ____________________________________________   LABS (all labs ordered are listed, but only abnormal results are displayed)  Labs Reviewed  BASIC METABOLIC PANEL - Abnormal; Notable for the following components:      Result Value   Glucose, Bld 185 (*)    All other components within normal limits  CBC - Abnormal; Notable for the following  components:   RBC 5.35 (*)    All other components within normal limits  URINALYSIS, COMPLETE (UACMP) WITH MICROSCOPIC   ____________________________________________  EKG   ____________________________________________  RADIOLOGY  ED MD interpretation: CT read as no acute findings per radiology  Official radiology report(s): Ct Renal Stone Study  Result Date: 08/18/2017 CLINICAL DATA:  Acute right flank pain, radiating to the right lower quadrant, vomiting, history of nephrolithiasis EXAM: CT ABDOMEN AND PELVIS WITHOUT CONTRAST TECHNIQUE: Multidetector CT imaging of the abdomen and pelvis was performed following the standard protocol without IV contrast. COMPARISON:  09/23/2015 FINDINGS: Lower chest: No acute abnormality. Hepatobiliary: No focal liver abnormality is seen. No gallstones, gallbladder wall thickening, or biliary dilatation. Pancreas: Unremarkable. No pancreatic ductal dilatation or surrounding inflammatory changes. Spleen: Normal in size without focal abnormality. Adrenals/Urinary Tract: Normal adrenal glands. No renal obstruction or hydronephrosis. No obstructing ureteral calculus, hydroureter, periureteral inflammation. Bilateral hypodense renal cysts again noted, largest on  the right posteriorly measures 5.1 cm. Nonobstructing subcentimeter left lower pole intrarenal calculus noted. Bladder is underdistended. Stomach/Bowel: No acute obstruction, significant dilatation, ileus, free air. Normal appendix. Minor scattered colonic diverticulosis without acute inflammation. No fluid collection or abscess. Vascular/Lymphatic: Aortoiliac atherosclerosis. Negative for aneurysm. No adenopathy. Reproductive: Uterus is atrophic. Stable prominence and calcifications of the left ovary, which measures 2.8 x 1.6 cm. Other: No abdominal wall hernia or abnormality. No abdominopelvic ascites. Musculoskeletal: Degenerative changes of the spine and facet joints diffusely. Remote right hip arthroplasty.  No acute osseous finding. IMPRESSION: No acute obstructing urinary tract or ureteral calculus. No acute obstructive uropathy. Punctate subcentimeter left kidney lower pole nonobstructing intrarenal calculus noted. Aortoiliac atherosclerosis without aneurysm Colonic diverticulosis without acute inflammation No acute intra-abdominal or pelvic finding by noncontrast CT Electronically Signed   By: Judie Petit.  Shick M.D.   On: 08/18/2017 14:32    ____________________________________________   PROCEDURES  Procedure(s) performed:   Procedures  Critical Care performed:   ____________________________________________   INITIAL IMPRESSION / ASSESSMENT AND PLAN / ED COURSE  patient's husband now tells me he was giving her a massage and felt a lump in her back on over the muscles. This does sound most consistent with muscle spasm and could explain the patient's pain. Patient has no weakness or numbness straight leg raise is negative CT is negative except for some degenerative joint disease. I will give her a little bit of pain medicine and refer her to orthopedics for further evaluation.         ____________________________________________   FINAL CLINICAL IMPRESSION(S) / ED DIAGNOSES  Final diagnoses:  Acute right-sided back pain, unspecified back location     ED Discharge Orders    None       Note:  This document was prepared using Dragon voice recognition software and may include unintentional dictation errors.    Arnaldo Natal, MD 08/18/17 1504

## 2017-08-18 NOTE — Discharge Instructions (Addendum)
continue to use the heat and cold. Remember not to fall asleep with a heating pad or ice pack as  you can get burned. you can continue to use your cream as well. I will give you some Percocet one or 2 pills 4 times a day as needed for the pain. Take the Phenergan about a half an hour before the Percocet to keep the nausea from being too bad. Please call the orthopedic doctor and arrange for follow-up. You can call them in the morning tomorrow. You  should go to get  to see them within the next few days. Please return here for fever vomiting worse pain or any weakness or incontinence. Remember Percocet can make you woozy and constipated. Do not drive on it. Do not fall.

## 2017-08-18 NOTE — ED Triage Notes (Signed)
C/o right flank pain. Does not radiate down leg.  Pain radiates to right groin area/RLQ.  Vomit X 1 from pain last night. Hx kidney stones.  Pt unsure if feels like stone.  Always had blood in urine. No fever.

## 2017-08-18 NOTE — ED Notes (Signed)
Pt requesting dilaudid by name, requesting PO dilaudid. Pt reports percocet gives her the "sweats" but agreeable to take.

## 2017-10-08 IMAGING — CT CT RENAL STONE PROTOCOL
3 of 4 series · 10 of 46 positions shown, 17 images · non-contrast
Comparison: None.

CLINICAL DATA: Left-sided flank pain with nausea and vomiting

EXAM:
CT ABDOMEN AND PELVIS WITHOUT CONTRAST
TECHNIQUE: Multidetector CT imaging of the abdomen and pelvis was performed
following the standard protocol without oral or intravenous contrast
material administration.

[Series 4: lung · axial · 0.80mm/px · z∈[-779,-679]mm · 6 of 29 slices shown, 11 images]
[im 5/29  soft-tissue]
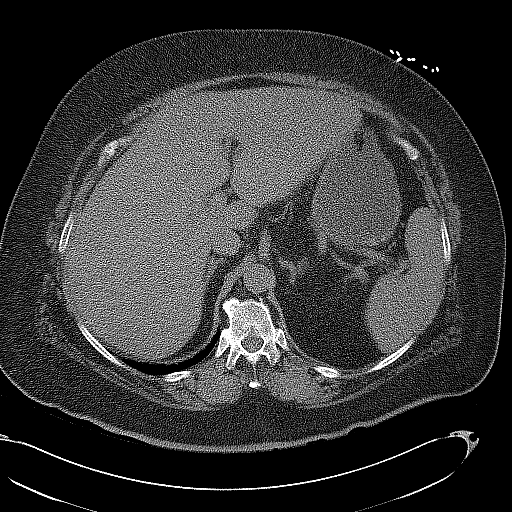
[im 5/29  bone]
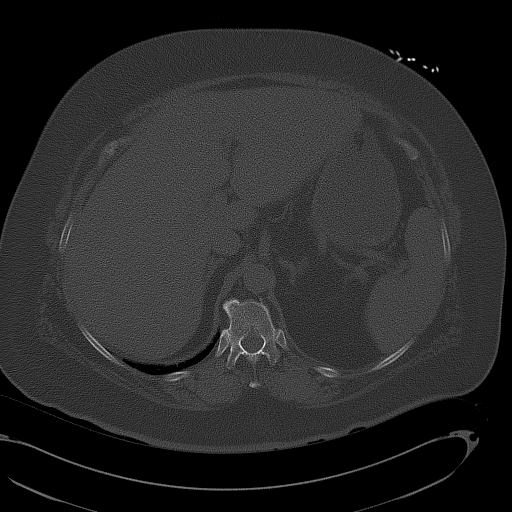
[im 9/29  soft-tissue]
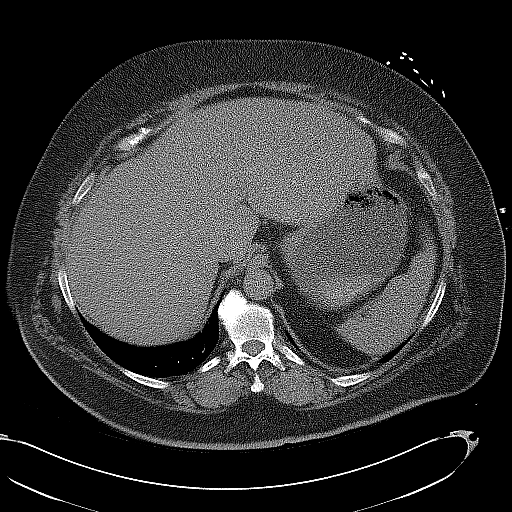
[im 13/29  soft-tissue]
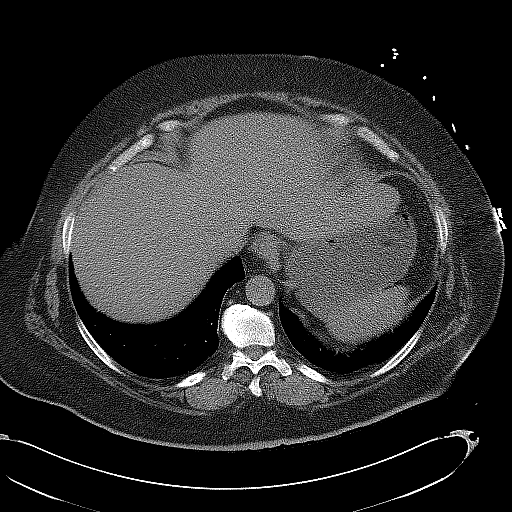
[im 13/29  lung]
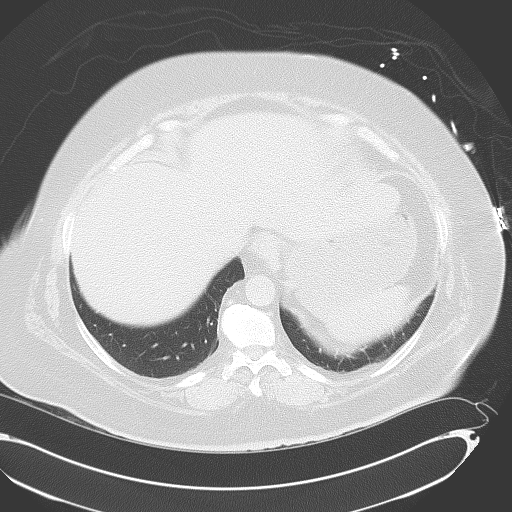
[im 17/29  soft-tissue]
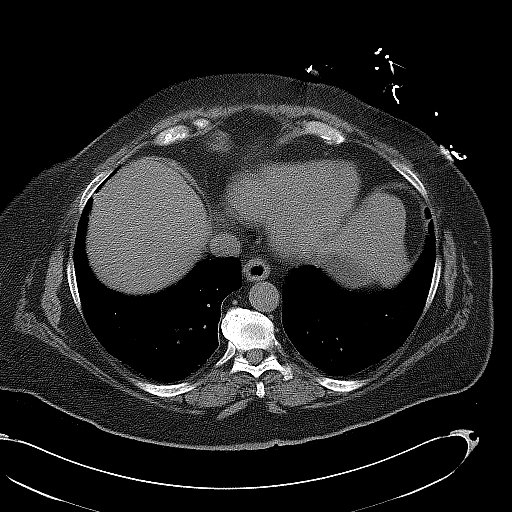
[im 17/29  lung]
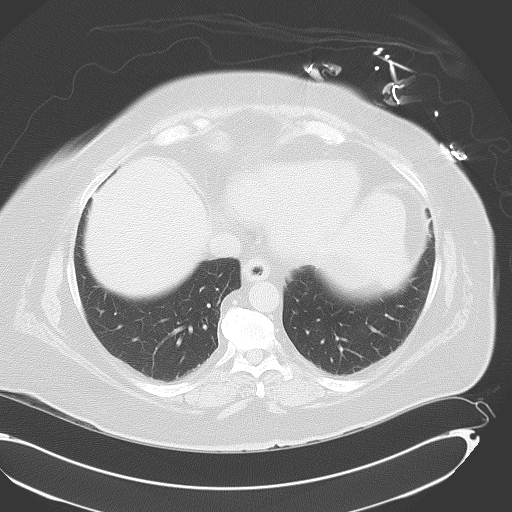
[im 21/29  soft-tissue]
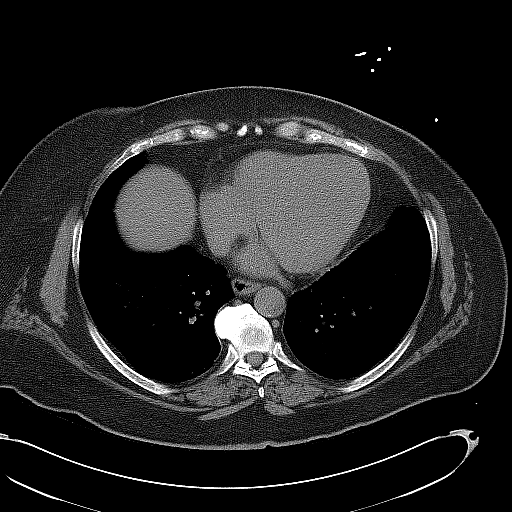
[im 21/29  lung]
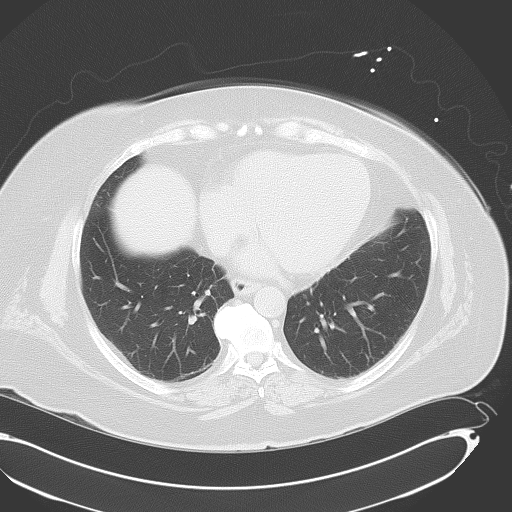
[im 25/29  soft-tissue]
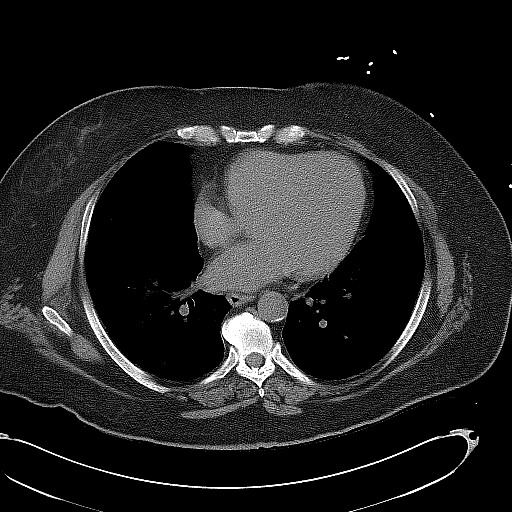
[im 25/29  lung]
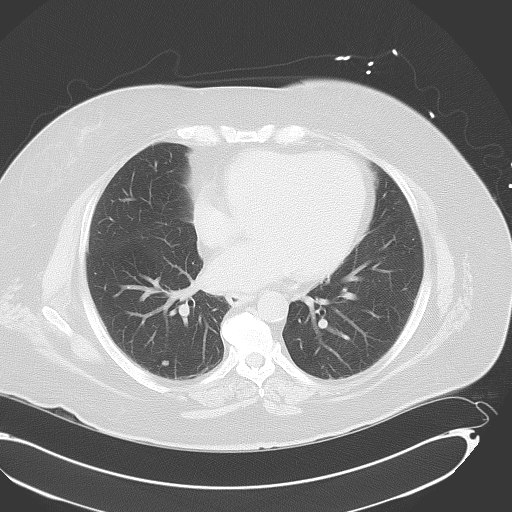

[Series 5: coronal · coronal · 0.81mm/px · 3 of 168 slices shown, 4 images]
[im 56/168  soft-tissue]
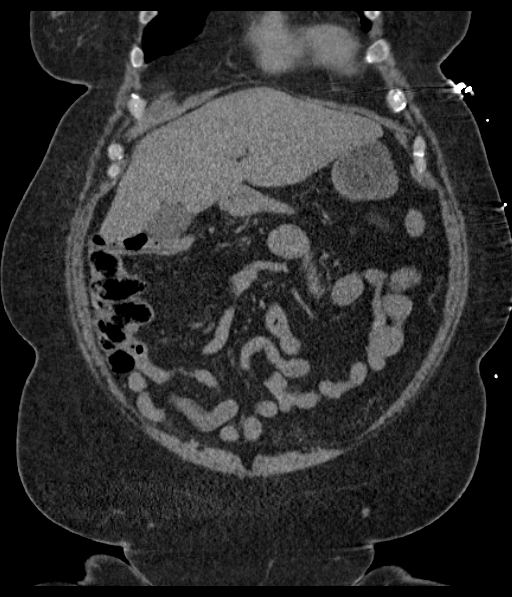
[im 75/168  soft-tissue]
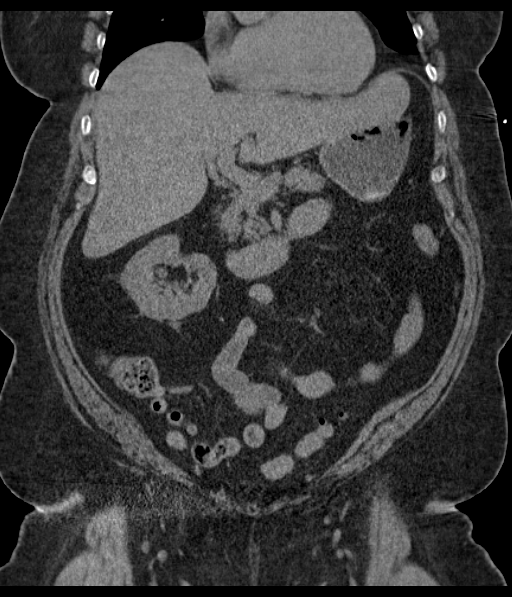
[im 75/168  bone]
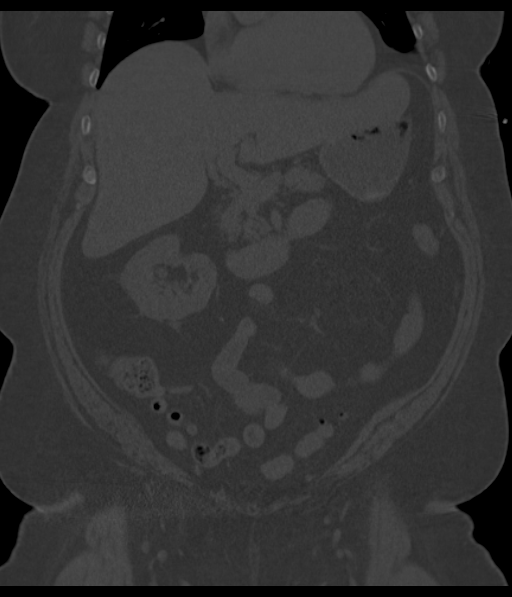
[im 93/168  soft-tissue]
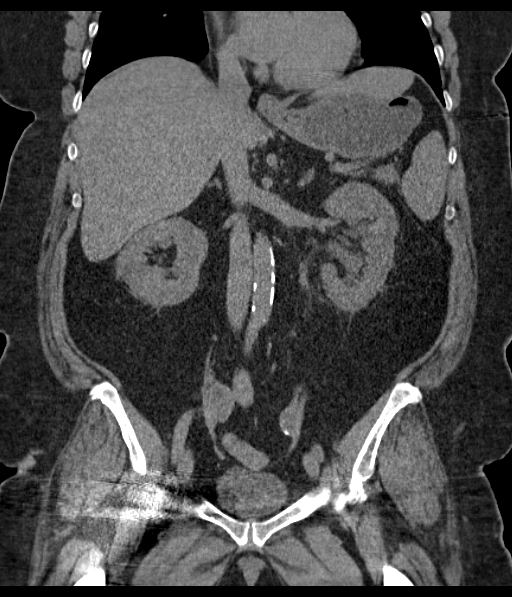

[Series 6: sagittal · sagittal · 0.69mm/px · 1 of 206 slices shown, 2 images]
[im 69/206  soft-tissue]
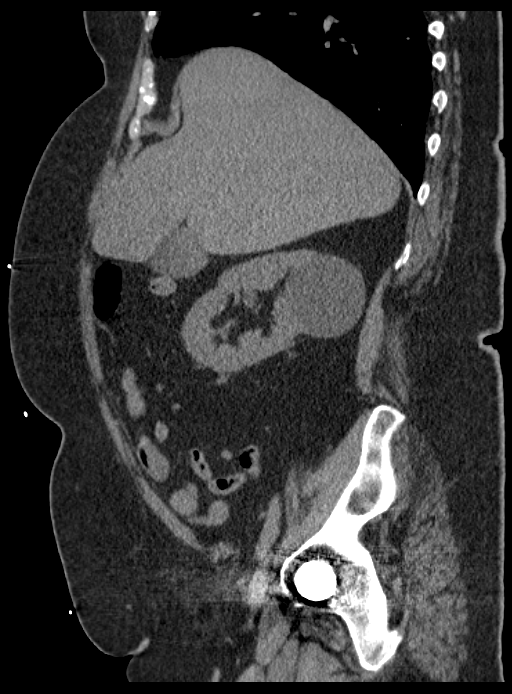
[im 69/206  bone]
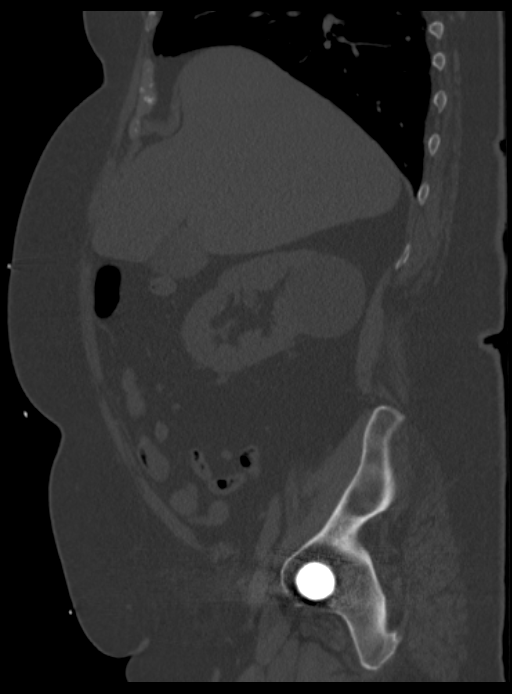

[10 of 46 positions shown; findings below may reference images not displayed]

FINDINGS: Lower chest:  Lung bases are clear.

Hepatobiliary: No focal liver lesions are identified on this
noncontrast enhanced study. There appears to be sludge within the
gallbladder. There is no gallbladder wall thickening. No biliary
duct dilatation evident

Pancreas: No pancreatic mass or inflammatory focus.

Spleen: No splenic lesions are evident.

Adrenals/Urinary Tract: No adrenal lesions evident. There is a cyst
arising from the posterior mid right kidney measuring 5.2 x 4.9 cm.
There is a cyst in the posterior mid right kidney measuring 1.8 x
1.7 cm. There is a cyst in the mid lateral mid kidney on the left
measuring 2.5 x 2.3 cm. There is a probable parapelvic cyst on the
left measuring 1.8 x 1.2 cm. There is no demonstrable hydronephrosis
on the right. There is mild hydronephrosis on the left. There is no
renal or ureteral calculus on the right. On the left, there is no
intrarenal calculus. There is a calculus in the proximal left ureter
just beyond the left ureteropelvic junction measuring 5 x 4 mm. No
other ureteral calculi are identified. Urinary bladder is midline
with wall thickness within normal limits.

Stomach/Bowel: There is no bowel wall or mesenteric thickening. No
bowel obstruction. No free air or portal venous air.

Vascular/Lymphatic: There is atherosclerotic calcification in the
aorta and common iliac arteries. There does not appear to be major
mesenteric vessel high-grade obstruction or occlusion on this
noncontrast enhanced study. There is no adenopathy in the abdomen or
pelvis.

Reproductive: Uterus is anteverted. There is no pelvic mass or
pelvic fluid collection.

Other: Appendix appears normal. No abscess or ascites is evident in
the abdomen or pelvis.

Musculoskeletal: There is degenerative change in the lower lumbar
spine. There is vacuum phenomenon at L5-S1. There are no blastic or
lytic bone lesions. There is a total hip replacement on the right.
No intramuscular or abdominal wall lesion evident.
IMPRESSION: 5 x 4 mm calculus in the proximal left ureter with mild
hydronephrosis on the left.

No bowel obstruction.  No abscess.  Appendix appears normal.

There appears to be sludge in the gallbladder. The gallbladder wall
does not appear thickened.

## 2020-03-23 DIAGNOSIS — E1165 Type 2 diabetes mellitus with hyperglycemia: Secondary | ICD-10-CM | POA: Diagnosis not present

## 2020-03-23 DIAGNOSIS — M47819 Spondylosis without myelopathy or radiculopathy, site unspecified: Secondary | ICD-10-CM | POA: Diagnosis not present

## 2020-03-29 DIAGNOSIS — E1165 Type 2 diabetes mellitus with hyperglycemia: Secondary | ICD-10-CM | POA: Diagnosis not present

## 2020-04-27 DIAGNOSIS — E785 Hyperlipidemia, unspecified: Secondary | ICD-10-CM | POA: Diagnosis not present

## 2020-04-27 DIAGNOSIS — I10 Essential (primary) hypertension: Secondary | ICD-10-CM | POA: Diagnosis not present

## 2020-04-27 DIAGNOSIS — Z23 Encounter for immunization: Secondary | ICD-10-CM | POA: Diagnosis not present

## 2020-04-27 DIAGNOSIS — Z794 Long term (current) use of insulin: Secondary | ICD-10-CM | POA: Diagnosis not present

## 2020-04-27 DIAGNOSIS — R42 Dizziness and giddiness: Secondary | ICD-10-CM | POA: Diagnosis not present

## 2020-04-27 DIAGNOSIS — E1165 Type 2 diabetes mellitus with hyperglycemia: Secondary | ICD-10-CM | POA: Diagnosis not present

## 2020-05-17 ENCOUNTER — Ambulatory Visit: Payer: Medicare Other | Admitting: Family Medicine

## 2022-03-27 ENCOUNTER — Ambulatory Visit: Payer: Medicare Other | Admitting: Family Medicine
